# Patient Record
Sex: Female | Born: 1945 | Hispanic: Refuse to answer | Marital: Single | Smoking: Never smoker
Health system: Western US, Academic
[De-identification: ages and names within clinical notes are randomized; demographics above are authoritative.]

## PROBLEM LIST (undated history)

## (undated) MED ORDER — GABAPENTIN 600 MG OR TABS
ORAL_TABLET | ORAL | 0 refills | Status: AC
Start: 2019-01-12 — End: ?

---

## 2017-10-18 ENCOUNTER — Encounter: Payer: Self-pay | Admitting: Hospital

## 2017-10-18 ENCOUNTER — Encounter (INDEPENDENT_AMBULATORY_CARE_PROVIDER_SITE_OTHER): Payer: Self-pay | Admitting: Neurology

## 2017-10-18 ENCOUNTER — Ambulatory Visit (INDEPENDENT_AMBULATORY_CARE_PROVIDER_SITE_OTHER): Admitting: Neurology

## 2017-10-18 VITALS — BP 146/83 | HR 60 | Ht 65.0 in | Wt 177.0 lb

## 2017-10-18 DIAGNOSIS — G2581 Restless legs syndrome: Principal | ICD-10-CM

## 2017-10-18 DIAGNOSIS — G250 Essential tremor: Secondary | ICD-10-CM

## 2017-10-18 DIAGNOSIS — G3184 Mild cognitive impairment, so stated: Secondary | ICD-10-CM

## 2017-10-18 MED ORDER — AZELASTINE HCL 0.05 % OP SOLN: 1.00 [drp] | Freq: Two times a day (BID) | OPHTHALMIC | Status: AC

## 2017-10-18 MED ORDER — TEMAZEPAM 30 MG OR CAPS: 30.00 mg | ORAL_CAPSULE | Freq: Every evening | ORAL | Status: AC | PRN

## 2017-10-18 MED ORDER — ALENDRONATE SODIUM 70 MG OR TABS
70.00 mg | ORAL_TABLET | ORAL | Status: DC
Start: ? — End: 2018-04-20

## 2017-10-18 MED ORDER — GABAPENTIN 600 MG OR TABS
1200.00 mg | ORAL_TABLET | Freq: Three times a day (TID) | ORAL | 3 refills | Status: AC
Start: 2017-10-18 — End: ?

## 2017-10-18 MED ORDER — MONTELUKAST SODIUM 10 MG OR TABS
10.00 mg | ORAL_TABLET | Freq: Every evening | ORAL | Status: DC
Start: ? — End: 2018-10-31

## 2017-10-18 MED ORDER — BUPROPION XL (DAILY) 300 MG OR TB24
300.00 mg | ORAL_TABLET | Freq: Every day | ORAL | Status: DC
Start: ? — End: 2018-04-20

## 2017-10-18 MED ORDER — ESCITALOPRAM OXALATE 20 MG OR TABS
20.00 mg | ORAL_TABLET | Freq: Every day | ORAL | Status: DC
Start: ? — End: 2018-04-20

## 2017-10-18 MED ORDER — GABAPENTIN 600 MG OR TABS
600.00 mg | ORAL_TABLET | Freq: Three times a day (TID) | ORAL | Status: DC
Start: ? — End: 2017-10-18

## 2017-10-18 MED ORDER — TEMAZEPAM 30 MG OR CAPS
ORAL_CAPSULE | ORAL | Status: DC
Start: 2017-10-12 — End: 2018-04-20

## 2017-10-18 MED ORDER — HYDROCODONE-ACETAMINOPHEN 10-325 MG OR TABS
1.00 | ORAL_TABLET | Freq: Four times a day (QID) | ORAL | Status: DC | PRN
Start: ? — End: 2018-01-17

## 2017-10-18 MED ORDER — AMOXICILLIN 500 MG OR CAPS
500.00 mg | ORAL_CAPSULE | Freq: Three times a day (TID) | ORAL | Status: DC
Start: ? — End: 2018-04-20

## 2017-10-18 MED ORDER — PRAMIPEXOLE DIHYDROCHLORIDE 1.5 MG OR TABS
1.50 mg | ORAL_TABLET | Freq: Three times a day (TID) | ORAL | Status: DC
Start: ? — End: 2017-10-18

## 2017-10-18 MED ORDER — PRAMIPEXOLE DIHYDROCHLORIDE 1.5 MG OR TABS
1.5000 mg | ORAL_TABLET | Freq: Three times a day (TID) | ORAL | 3 refills | Status: DC
Start: 2017-10-18 — End: 2018-08-21

## 2017-10-18 MED ORDER — KETOROLAC TROMETHAMINE 0.5 % OP SOLN: 1.00 [drp] | Freq: Four times a day (QID) | OPHTHALMIC | Status: AC

## 2018-01-17 ENCOUNTER — Ambulatory Visit (INDEPENDENT_AMBULATORY_CARE_PROVIDER_SITE_OTHER): Admitting: Neurology

## 2018-01-17 VITALS — BP 126/77 | HR 82

## 2018-01-17 DIAGNOSIS — G2581 Restless legs syndrome: Principal | ICD-10-CM

## 2018-01-17 MED ORDER — HYDROCODONE-ACETAMINOPHEN 10-325 MG OR TABS
1.0000 | ORAL_TABLET | Freq: Every day | ORAL | 0 refills | Status: AC
Start: 2018-01-17 — End: ?

## 2018-01-17 NOTE — Progress Notes (Signed)
Maureen Huang    January 17, 2018      REASON FOR EVALUATION:  RLS, tremor, memory loss     HISTORY:  Maureen Huang is a 72 year old female   R handed.    She is going to have R foot surgery tomorrow for a chronic R foot pain resulted from a previous foot surgery    She still suffers from RLS symptoms when she does not take her medications.  She frequently forgets to take her meds and sometimes she avoids to.   When she takes her medications for RLS, she feels also sedated.    She also rejects of taking pills.    She has used Hydrocodone PRN for pain and only for if the RLS is too disturbing, up to 2 per week.    Currently on Mirapex 1.5mg  TID, Neurontin 600mg , 2 at HS and Restoril 30mg  QHS.   She has failed: Requip, Lyrica,   She has not tried Sinemet, Neupro, Klonopin or Horizant.   She also is not compliant with her antidepressants.      She denies there is decline in her memory.  Again, worse when she is stressed.  Sometimes she does not remember her dog's names.      She is still physically active swimming daily.    She has poor social and family support.    She has has panic attacks.        TESTING:    MMSE from 10/18/17: 27/30, 2/3 delayed recall    REVIEW OF SYSTEMS:  Review of systems are as outlined in the patient questionnaire.      PAST MEDICAL HISTORY:    Depression, RLS, OSA but could not tolerate CPAP, COPD, colon cancer, celiac disease, fibromyalgia, IBS    PAST SURGICAL HISTORY:    Multiple orthopedic surgeries for the R foot, toes, R knee and surgery for colon cancer,      MEDICATIONS:    Current Outpatient Medications   Medication Sig Dispense Refill   . alendronate (FOSAMAX) 70 MG tablet Take 70 mg by mouth every 7 days.     Marland Kitchen amoxicillin (AMOXIL) 500 MG capsule Take 500 mg by mouth 3 times daily.     Marland Kitchen azelastine (OPTIVAR) 0.05 % ophthalmic solution Place 1 drop into both eyes 2 times daily. Use in affected eye(s)     . buPROPion (WELLBUTRIN) 300 MG Extended-Release tablet Take 300 mg by mouth  daily.     Marland Kitchen escitalopram (LEXAPRO) 20 MG tablet Take 20 mg by mouth daily.     Marland Kitchen gabapentin (NEURONTIN) 600 MG tablet Take 2 tablets (1,200 mg) by mouth 3 times daily. 540 tablet 3   . HYDROcodone-acetaminophen (NORCO) 10-325 MG tablet Take 1 tablet by mouth every 6 hours as needed for Moderate Pain (Pain Score 4-6).     Marland Kitchen ketorolac (ACULAR) 0.5 % ophthalmic solution Place 1 drop into both eyes 4 times daily.     . montelukast (SINGULAIR) 10 MG tablet Take 10 mg by mouth every evening.     . pramipexole (MIRAPEX) 1.5 MG tablet Take 1 tablet (1.5 mg) by mouth 3 times daily. 270 tablet 3   . temazepam (RESTORIL) 30 MG capsule      . temazepam (RESTORIL) 30 MG capsule Take 30 mg by mouth nightly as needed for Insomnia.       No current facility-administered medications for this visit.        ALLERGIES:    Allergies  Allergen Reactions   . Morphine Hives       SOCIAL HISTORY:    Lives alone.  No smoking.  1-2 drinks per day     FAMILY HISTORY:    Cancer, CAD, CVA, PD    NEUROLOGIC EXAMINATION:  BP 126/77 (BP Location: Left arm, BP Patient Position: Sitting, BP cuff size: Regular)   Pulse 82     Alert, not oriented to time.            IMPRESSION AND PLAN:   1. RLS (restless legs syndrome)  Same Mirapex 1.5mg  TID, Neurontin 600mg , 2 at HS and Restoril 30mg  QHS.   Consider changing to Neupro later     2. Essential tremor  Continue to monitor for now    3. MCI (mild cognitive impairment)  Consider neuropsych testing if worse.  Will monitor for now.      PLAN:    Follow up 3 months    Will give a Rx of Hydrocodone for #30 for PRN    Continue Mirapex and Neurontin at current dose.    Try Neupro patch next time      I have spent 25 minutes with the patient and family members, of which more than 50% of the time was spent counseling.        Electronically signed by: Bertell Maria, MD   Signature Derived From Controlled Access Password, January 17, 2018, 9:28 AM      Certified, American Board of Neurology and Psychiatry -  Neurology   Movement disorder specialist        CC:   Jerilynn Mages, MD  [address]  [place], [place] [address]

## 2018-01-25 ENCOUNTER — Telehealth (INDEPENDENT_AMBULATORY_CARE_PROVIDER_SITE_OTHER): Payer: Self-pay | Admitting: Neurology

## 2018-01-25 NOTE — Telephone Encounter (Signed)
01/25/18   Pt called to say her pharmacist would not accept the Hydrocodone because the pharmacist felt it is not needed for pt's RLS // Recommended she go to a different pharmacist // Pt said she called her previous neurologist who is sending her medical records to Dr. Regino Schultze // dec

## 2018-04-20 ENCOUNTER — Encounter (INDEPENDENT_AMBULATORY_CARE_PROVIDER_SITE_OTHER): Payer: Self-pay | Admitting: Neurology

## 2018-04-20 ENCOUNTER — Ambulatory Visit (INDEPENDENT_AMBULATORY_CARE_PROVIDER_SITE_OTHER): Admitting: Neurology

## 2018-04-20 VITALS — BP 131/85 | HR 87 | Wt 180.0 lb

## 2018-04-20 DIAGNOSIS — G2581 Restless legs syndrome: Principal | ICD-10-CM

## 2018-04-20 DIAGNOSIS — G250 Essential tremor: Secondary | ICD-10-CM

## 2018-04-20 DIAGNOSIS — G3184 Mild cognitive impairment, so stated: Secondary | ICD-10-CM

## 2018-04-20 MED ORDER — RANITIDINE HCL 150 MG OR TABS
150.00 mg | ORAL_TABLET | Freq: Two times a day (BID) | ORAL | 3 refills | Status: DC
Start: 2018-04-12 — End: 2018-10-31

## 2018-04-20 MED ORDER — OXYBUTYNIN CHLORIDE 5 MG OR TABS
5.00 mg | ORAL_TABLET | Freq: Every day | ORAL | 3 refills | Status: AC
Start: 2018-04-18 — End: ?

## 2018-04-20 MED ORDER — PANTOPRAZOLE SODIUM 40 MG OR TBEC
40.00 mg | DELAYED_RELEASE_TABLET | Freq: Two times a day (BID) | ORAL | 3 refills | Status: AC
Start: 2018-04-12 — End: ?

## 2018-04-20 MED ORDER — CEPHALEXIN 500 MG OR CAPS
ORAL_CAPSULE | ORAL | 0 refills | Status: DC
Start: 2018-02-18 — End: 2018-08-21

## 2018-04-20 NOTE — Interdisciplinary (Signed)
.  I, Hosey Burmester S., have reviewed medications and allergies with this patient.

## 2018-04-20 NOTE — Progress Notes (Signed)
[name]    April 20, 2018      REASON FOR EVALUATION:  RLS, tremor, memory loss     HISTORY:  Maureen Huang is a 72 year old female   R handed.    She reports that her RLE has been better since she recovered from foot surgery when she needed to be at rest.    She is usually active during the day and she does not feel the RLS during the day.    Currently taking Mirapex 1.5mg  2 QHS, Neurontin 600mg , 2 at HS and Restoril 30mg  QHS.   She has not required to take Hydrocodone that was given to her about 4 months ago.      She has failed: Requip, Lyrica,   She has not tried Sinemet, Neupro, Klonopin or Horizant.   She also is not compliant with her antidepressants.  She denies that she is depressed any more      She denies there is decline in her memory.  Again, worse when she is stressed.  Sometimes she does not remember her dog's names.      She reports that there was a mistake in her medical records from Dr. Sherryll Burger indicating that she was opiate dependency and had Hx of ETOH abuse.  She claimed that those statements were not true.  She brought a disclaimer trying to clarify that.        TESTING:    MMSE from 10/18/17: 27/30, 2/3 delayed recall    REVIEW OF SYSTEMS:  Review of systems are as outlined in the patient questionnaire.      PAST MEDICAL HISTORY:    Depression, RLS, OSA but could not tolerate CPAP, COPD, colon cancer, celiac disease, fibromyalgia, IBS    PAST SURGICAL HISTORY:    Multiple orthopedic surgeries for the R foot, toes, R knee and surgery for colon cancer,      MEDICATIONS:    Current Outpatient Medications   Medication Sig Dispense Refill   . azelastine (OPTIVAR) 0.05 % ophthalmic solution Place 1 drop into both eyes 2 times daily. Use in affected eye(s)     . cephALEXin (KEFLEX) 500 MG capsule   0   . gabapentin (NEURONTIN) 600 MG tablet Take 2 tablets (1,200 mg) by mouth 3 times daily. 540 tablet 3   . HYDROcodone-acetaminophen (NORCO) 10-325 MG tablet Take 1 tablet by mouth daily. 30 tablet  0   . ketorolac (ACULAR) 0.5 % ophthalmic solution Place 1 drop into both eyes 4 times daily.     . montelukast (SINGULAIR) 10 MG tablet Take 10 mg by mouth every evening.     Marland Kitchen oxybutynin (DITROPAN) 5 MG tablet Take 5 mg by mouth daily.  3   . pantoprazole (PROTONIX) 40 MG tablet Take 40 mg by mouth 2 times daily.  3   . pramipexole (MIRAPEX) 1.5 MG tablet Take 1 tablet (1.5 mg) by mouth 3 times daily. 270 tablet 3   . ranitidine (ZANTAC) 150 MG tablet Take 150 mg by mouth 2 times daily.  3   . temazepam (RESTORIL) 30 MG capsule Take 30 mg by mouth nightly as needed for Insomnia.       No current facility-administered medications for this visit.        ALLERGIES:    Allergies   Allergen Reactions   . Morphine Hives       SOCIAL HISTORY:    Lives alone.  No smoking.  1-2 drinks per day  FAMILY HISTORY:    Cancer, CAD, CVA, PD    NEUROLOGIC EXAMINATION:  BP 131/85 (BP Location: Left arm, BP Patient Position: Sitting, BP cuff size: Regular)   Pulse 87   Wt 81.6 kg (180 lb)   BMI 29.95 kg/m     Alert and Ox3  Fluent speech   No weakness of the LEs   Normal based and well balanced gait without needing support or assistance.        IMPRESSION AND PLAN:   1. RLS (restless legs syndrome)  Same Mirapex 1.5mg  2 PO QHS, Neurontin 600mg , 2 at HS and Restoril 30mg  QHS.   Consider changing to Neupro later.  Explained why there is no need to change meds for now  Will only give Hydrocodone #30.     2. Essential tremor  Continue to monitor for now    3. MCI (mild cognitive impairment)  Consider neuropsych testing if worse.  Will monitor for now.      PLAN:    Follow up 4 months    She still has Hydrocodone for #30 yearly for PRN leg pain    Same Mirapex and Neurontin at current dose.    Consider Neupro patch later if needed      I have spent 25 minutes with the patient and family members, of which more than 50% of the time was spent counseling.        Electronically signed by: Bertell Maria, MD   Signature Derived From  Controlled Access Password, April 20, 2018, 9:28 AM      Certified, American Board of Neurology and Psychiatry - Neurology   Movement disorder specialist        CC:   Jerilynn Mages, MD  [address]  [place], [place] [address]

## 2018-08-21 ENCOUNTER — Encounter (INDEPENDENT_AMBULATORY_CARE_PROVIDER_SITE_OTHER): Payer: Self-pay

## 2018-08-21 ENCOUNTER — Ambulatory Visit (INDEPENDENT_AMBULATORY_CARE_PROVIDER_SITE_OTHER)

## 2018-08-21 ENCOUNTER — Telehealth (INDEPENDENT_AMBULATORY_CARE_PROVIDER_SITE_OTHER): Payer: Self-pay

## 2018-08-21 VITALS — BP 131/85 | HR 77 | Ht 65.0 in | Wt 172.6 lb

## 2018-08-21 DIAGNOSIS — G2581 Restless legs syndrome: Principal | ICD-10-CM

## 2018-08-21 DIAGNOSIS — G3184 Mild cognitive impairment, so stated: Secondary | ICD-10-CM

## 2018-08-21 MED ORDER — ROTIGOTINE 4 MG/24HR TD PT24
1.0000 | MEDICATED_PATCH | Freq: Every day | TRANSDERMAL | 3 refills | Status: DC
Start: 2018-08-21 — End: 2018-08-24

## 2018-08-24 MED ORDER — ROTIGOTINE 4 MG/24HR TD PT24
1.0000 | MEDICATED_PATCH | Freq: Every day | TRANSDERMAL | 3 refills | Status: DC
Start: 2018-08-24 — End: 2018-10-31

## 2018-08-25 ENCOUNTER — Telehealth (INDEPENDENT_AMBULATORY_CARE_PROVIDER_SITE_OTHER): Payer: Self-pay

## 2018-08-25 NOTE — Telephone Encounter (Signed)
Noted//ps

## 2018-08-31 NOTE — Telephone Encounter (Signed)
Noted//ps

## 2018-09-12 ENCOUNTER — Telehealth (INDEPENDENT_AMBULATORY_CARE_PROVIDER_SITE_OTHER): Payer: Self-pay | Admitting: Neurology

## 2018-10-31 ENCOUNTER — Ambulatory Visit (INDEPENDENT_AMBULATORY_CARE_PROVIDER_SITE_OTHER): Admitting: Neurology

## 2018-10-31 ENCOUNTER — Telehealth (INDEPENDENT_AMBULATORY_CARE_PROVIDER_SITE_OTHER): Payer: Self-pay | Admitting: Neurology

## 2018-10-31 VITALS — BP 127/86 | HR 97 | Ht 65.0 in | Wt 174.4 lb

## 2018-10-31 DIAGNOSIS — G2581 Restless legs syndrome: Principal | ICD-10-CM

## 2018-10-31 DIAGNOSIS — G3184 Mild cognitive impairment, so stated: Secondary | ICD-10-CM

## 2018-10-31 DIAGNOSIS — G250 Essential tremor: Secondary | ICD-10-CM

## 2019-01-12 ENCOUNTER — Other Ambulatory Visit (INDEPENDENT_AMBULATORY_CARE_PROVIDER_SITE_OTHER): Payer: Self-pay | Admitting: Neurology

## 2019-02-01 ENCOUNTER — Encounter (INDEPENDENT_AMBULATORY_CARE_PROVIDER_SITE_OTHER)

## 2019-05-02 ENCOUNTER — Telehealth: Payer: Self-pay | Admitting: Hospital

## 2019-05-02 NOTE — Telephone Encounter (Signed)
Verified: Mobile Phone   MyChart Activation code sent to patient's: Mobile Phone

## 2019-05-17 ENCOUNTER — Ambulatory Visit (INDEPENDENT_AMBULATORY_CARE_PROVIDER_SITE_OTHER)

## 2020-11-26 IMAGING — CT CTA CHEST
2 of 3 series · 16 of 46 positions shown, 18 images · IV contrast (APPLIED)
Comparison: None

CTA CHEST, 11/26/2020 [DATE]: 
CLINICAL INDICATION: Thoracic aortic aneurysm. 
A search for DICOM formatted images was conducted for prior CT imaging studies 
completed at a non-affiliated media free facility.
TECHNIQUE: The chest was scanned from base of neck through the lung bases with 
100 mL of Isovue 370 injected intravenously on a high resolution low dose CT 
scanner using dose reduction techniques.  Routine MPR and MIP 3D renderings were 
reconstructed on an independent workstation with concurrent physician 
supervision. I stat GFR prior to imaging 61.5.

[Series 4: cta chest 2.0 i31s 3 · axial · 0.71mm/px · z∈[-255,+9]mm · 13 of 152 slices shown, 15 images]
[im 10/152  soft-tissue]
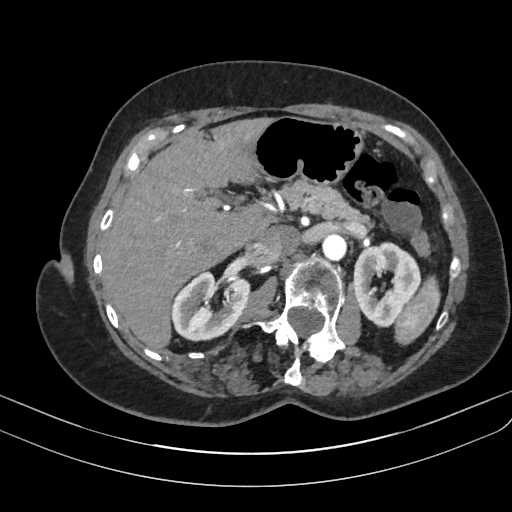
[im 10/152  bone]
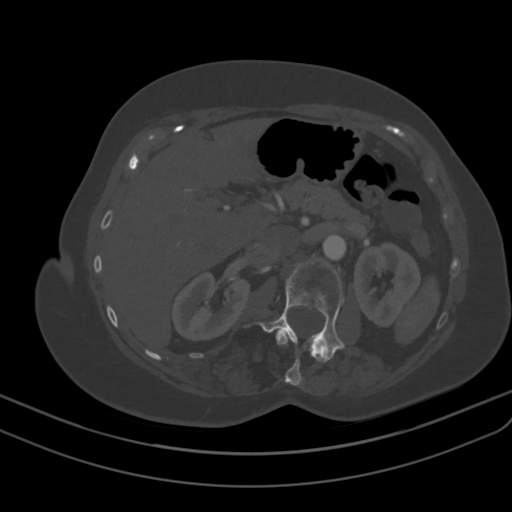
[im 20/152  soft-tissue]
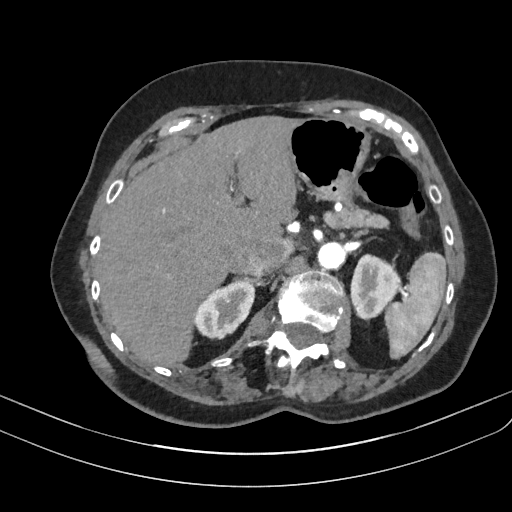
[im 30/152  soft-tissue]
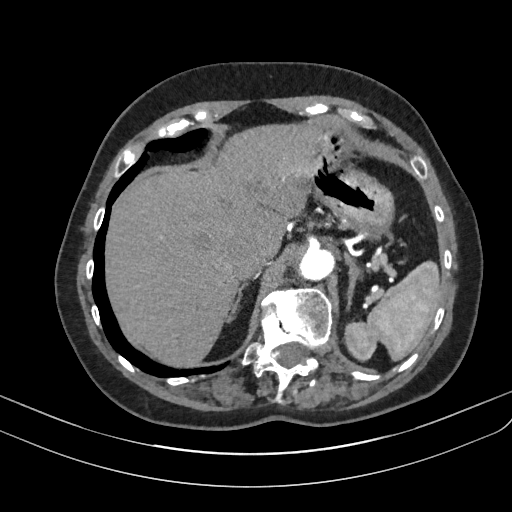
[im 44/152  soft-tissue]
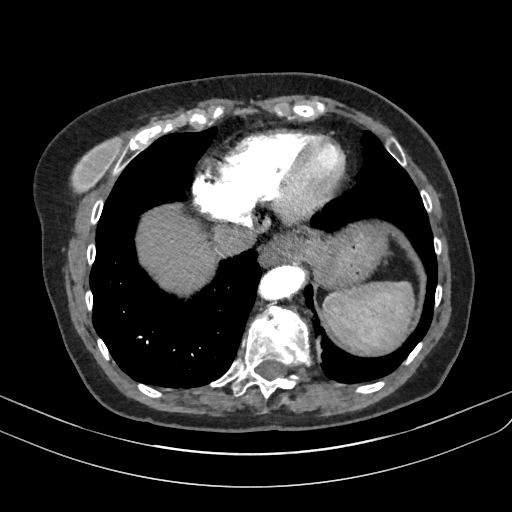
[im 54/152  soft-tissue]
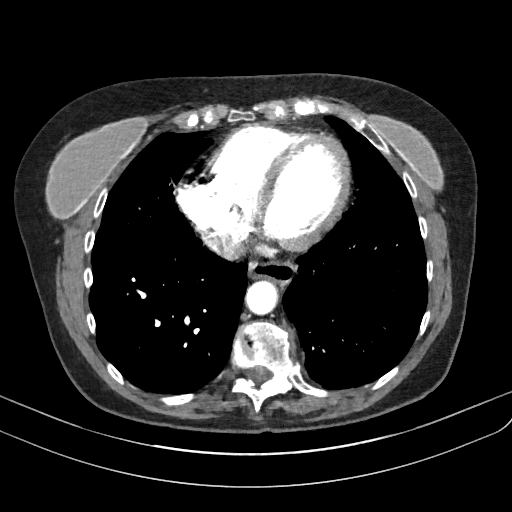
[im 64/152  soft-tissue]
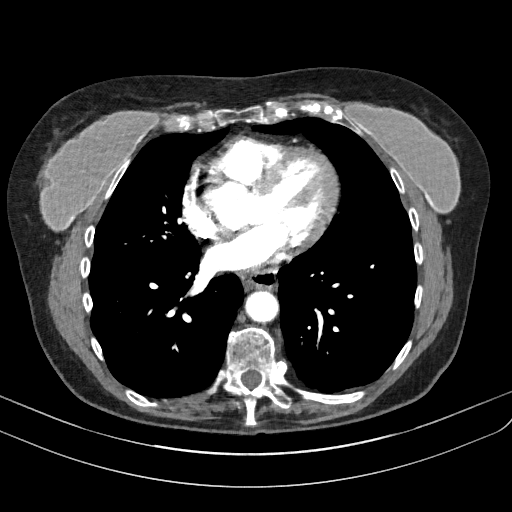
[im 78/152  soft-tissue]
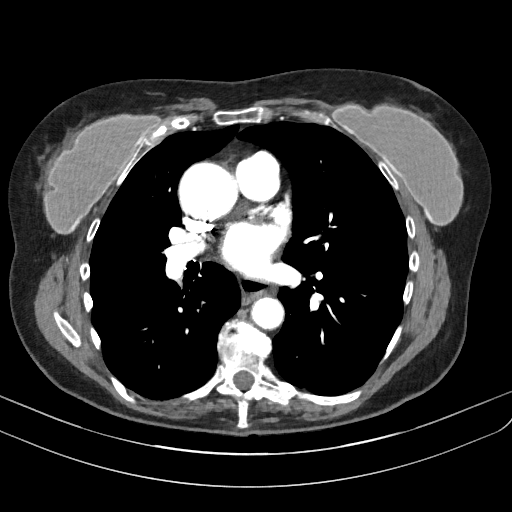
[im 88/152  soft-tissue]
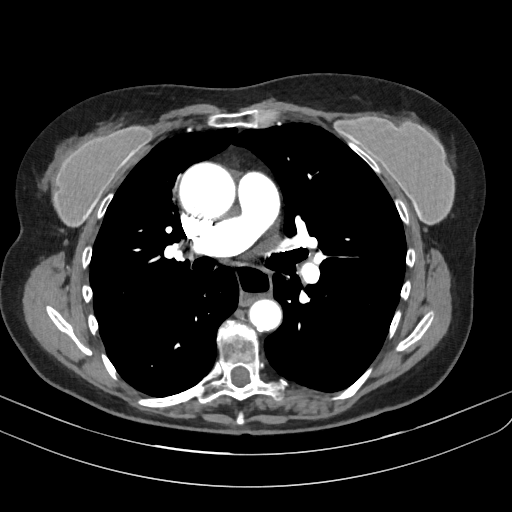
[im 98/152  soft-tissue]
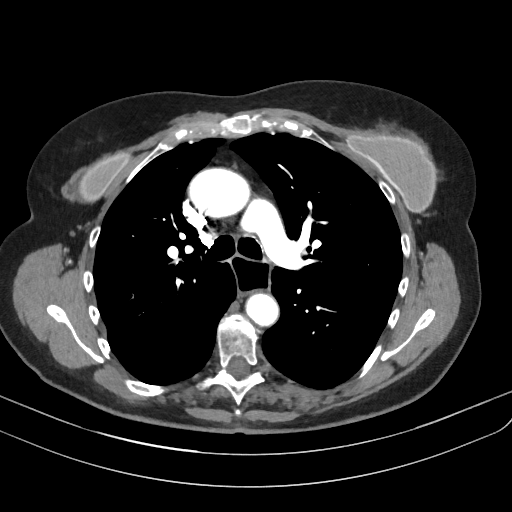
[im 98/152  bone]
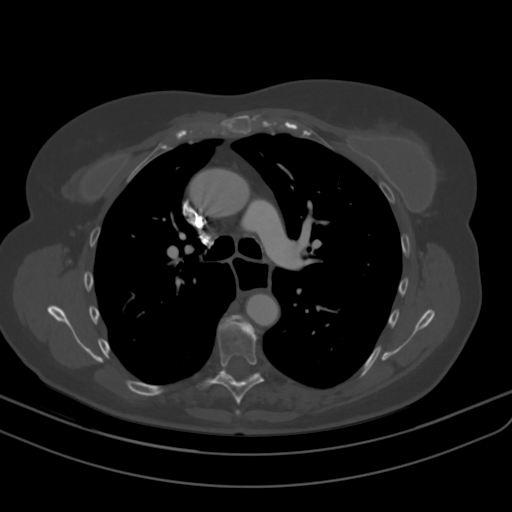
[im 108/152  soft-tissue]
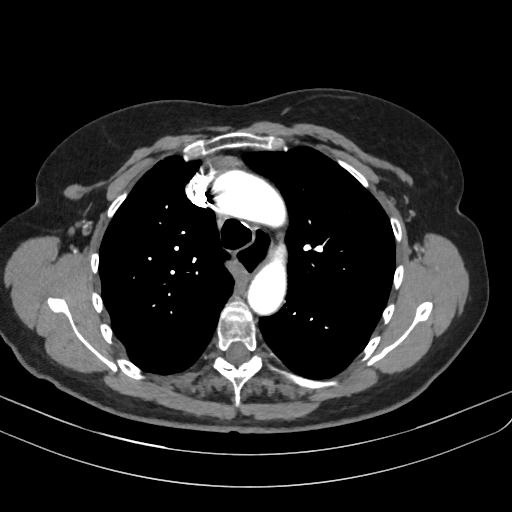
[im 122/152  soft-tissue]
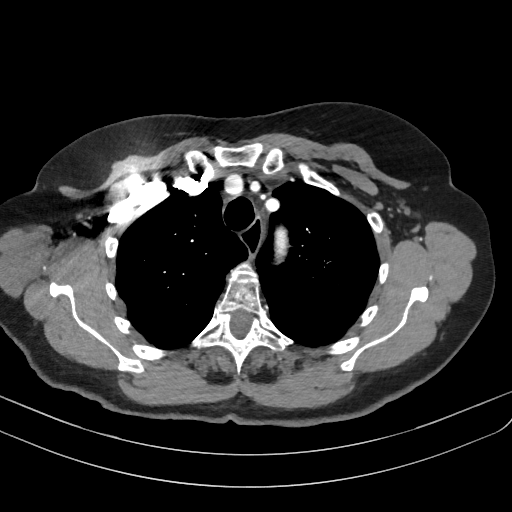
[im 132/152  soft-tissue]
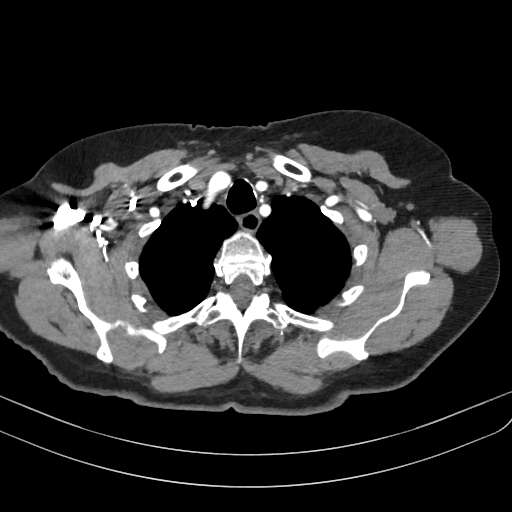
[im 142/152  soft-tissue]
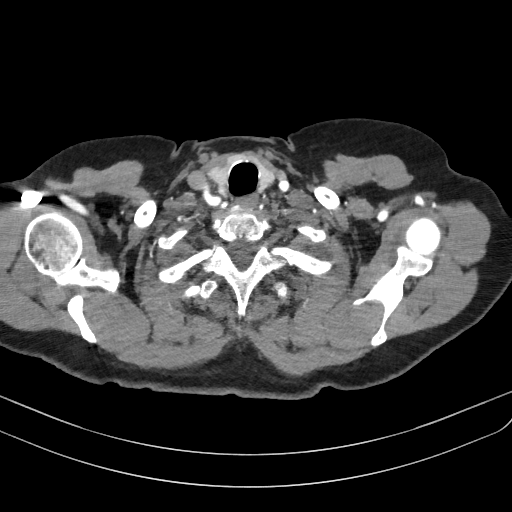

[Series 6: coronal · coronal · 0.59mm/px · 3 of 144 slices shown]
[im 48/144  soft-tissue]
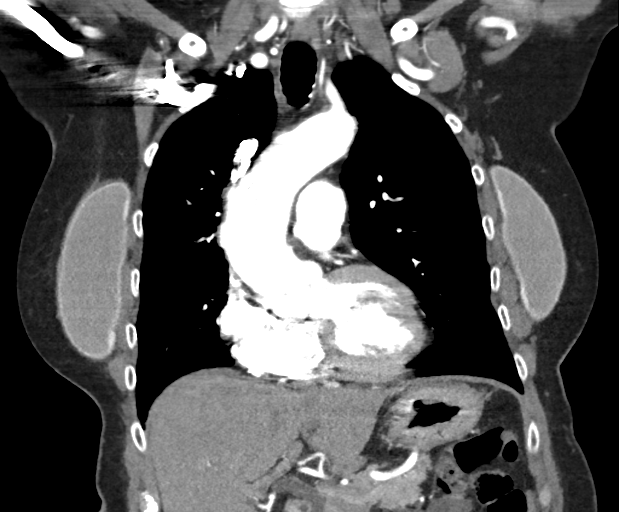
[im 64/144  soft-tissue]
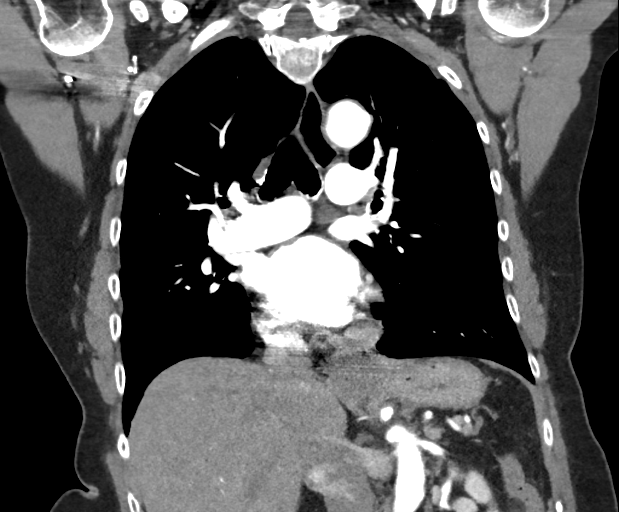
[im 80/144  soft-tissue]
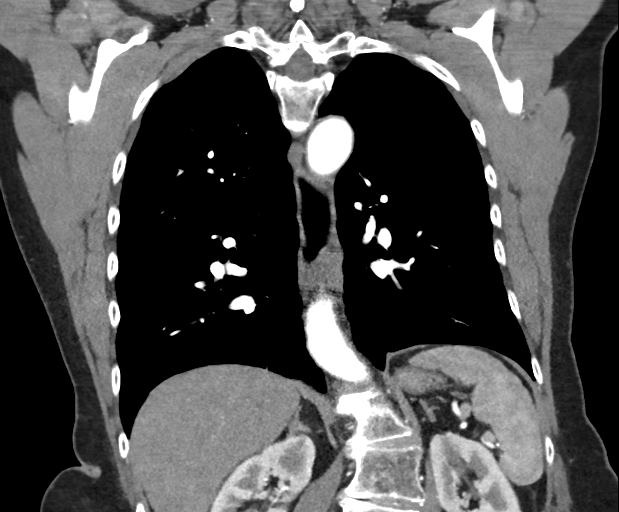

[16 of 46 positions shown; findings below may reference images not displayed]

FINDINGS: PULMONARY ARTERIES: No pulmonary embolus. 
LUNGS AND PLEURA: Lungs clear.   No effusions. 
MEDIASTINUM: No masses. Moderate distention esophagus with small hiatal hernia 
mild wall thickening of the gastroesophageal junction. Bilateral breast 
implants. 
LYMPH NODES: No adenopathy. 
HEART: Normal in size.  No pericardial effusion. Left ventricular outflow 
calcification. 
AORTA AND GREAT VESSELS: Mild fusiform dilatation ascending aorta measuring
cm.. 
OSSEOUS STRUCTURES: Degenerative change and levoscoliosis. No acute fracture or 
destructive lesion. 
UPPER ABDOMEN: Fatty liver.
IMPRESSION: 4.1 cm fusiform dilatation ascending aorta. 
Distended esophagus with small hiatal hernia mild wall thickening of the 
gastroesophageal junction. 
No mediastinal or hilar adenopathy. 
RADIATION DOSE REDUCTION: All CT scans are performed using radiation dose 
reduction techniques, when applicable.  Technical factors are evaluated and 
adjusted to ensure appropriate moderation of exposure.  Automated dose 
management technology is applied to adjust the radiation doses to minimize 
exposure while achieving diagnostic quality images.

## 2020-12-18 IMAGING — DX LUMBAR SPINE COMPLETE 4 VIEWS
1 series · 4 of 4 positions shown · non-contrast
Comparison: None.

THORACIC SPINE 3 VIEWS, LUMBAR SPINE COMPLETE 4 VIEWS, 12/18/2020 [DATE]: 
CLINICAL INDICATION:  Chronic back pain

[Series 1: lateral · 0.14mm/px · 4 of 4 slices shown]
[im 1/4]
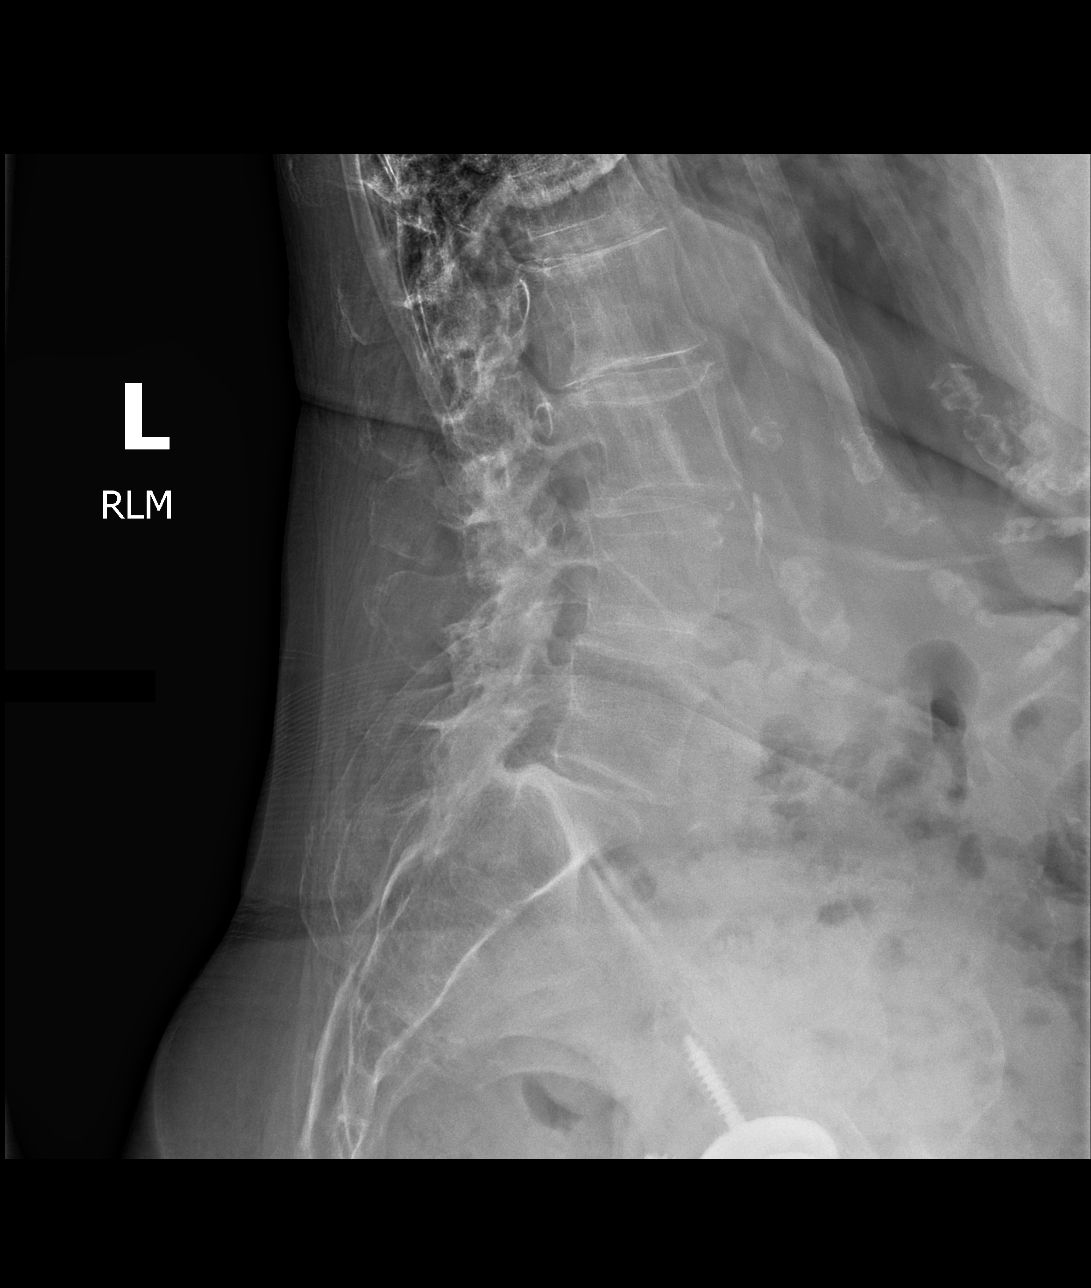
[im 2/4]
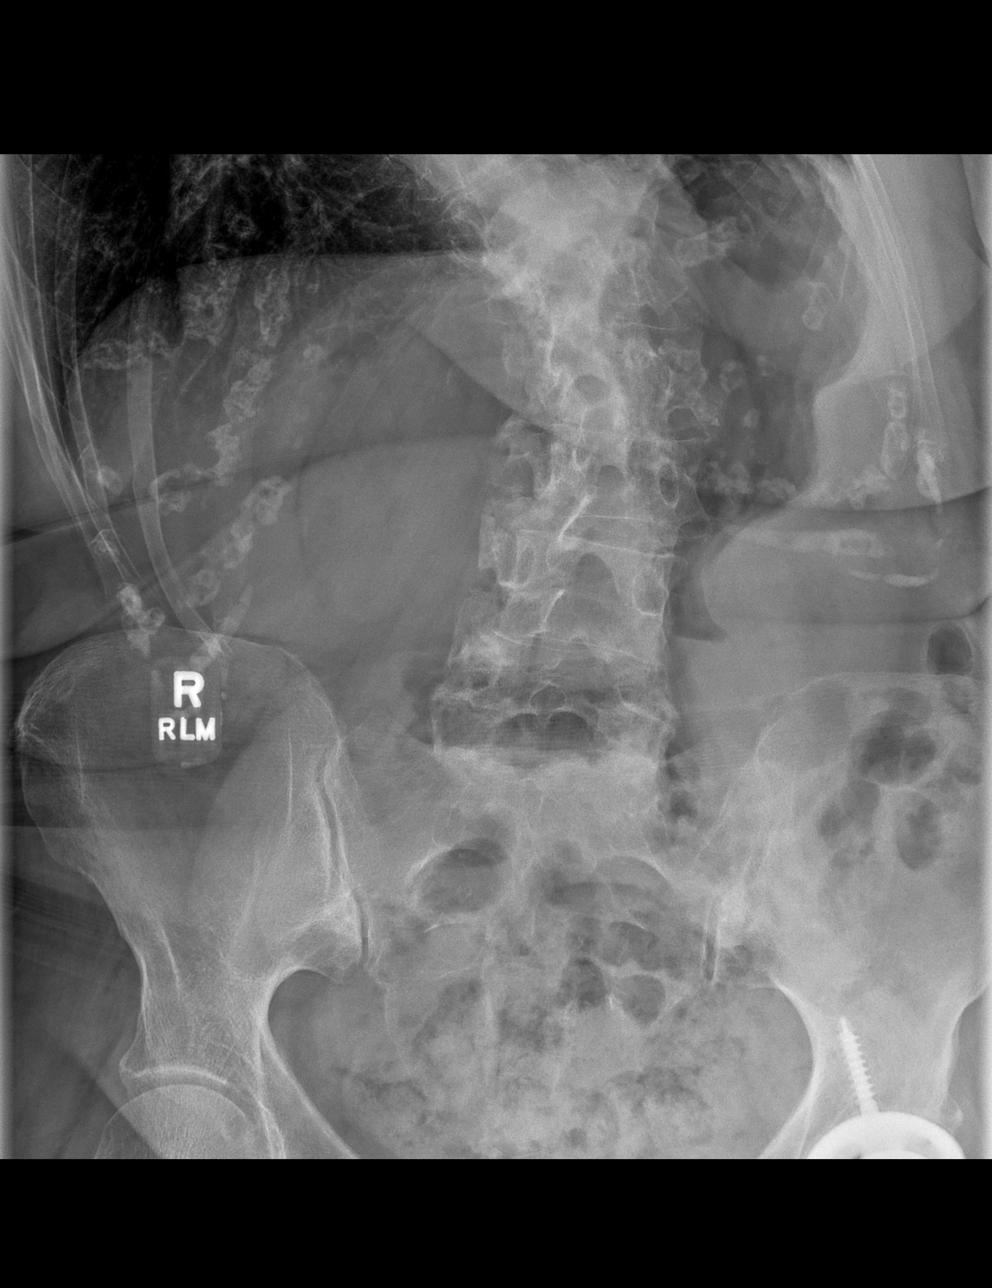
[im 3/4]
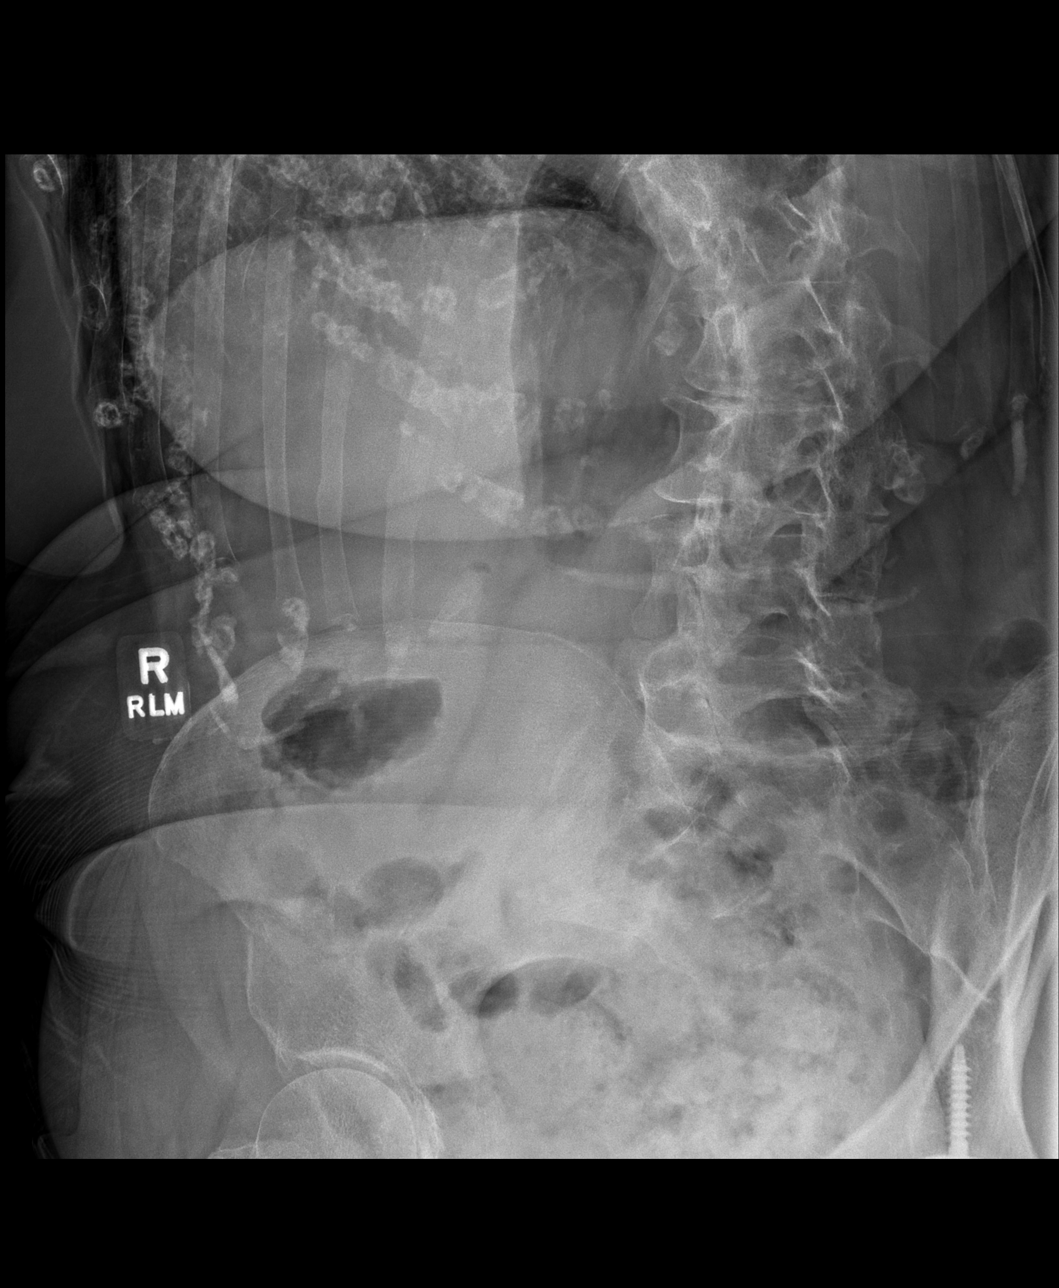
[im 4/4]
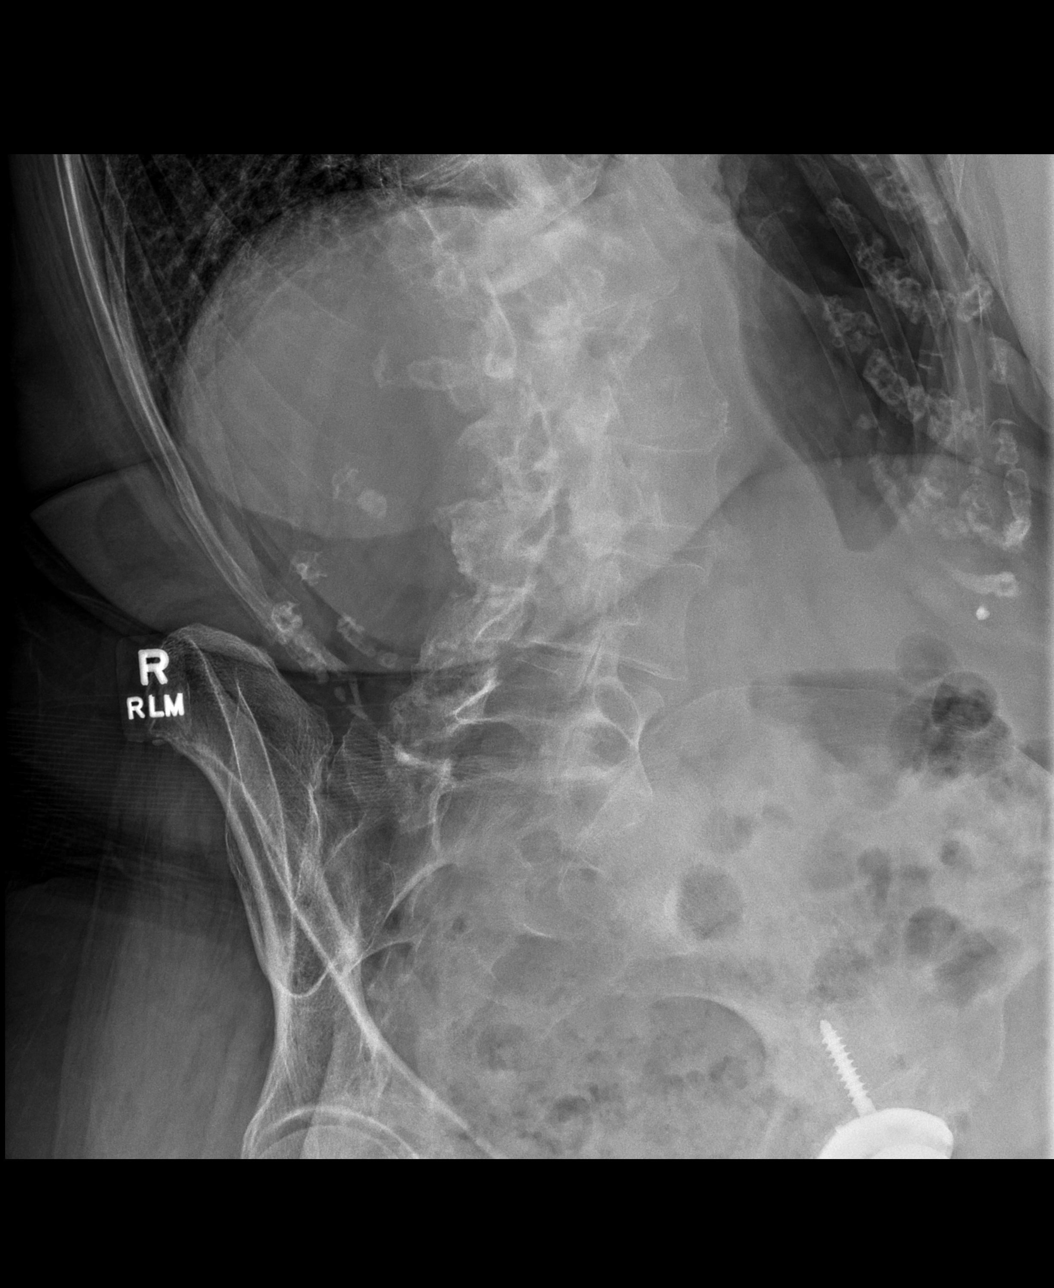

[4 of 4 positions shown; findings below may reference images not displayed]

FINDINGS: There is thoracolumbar levoscoliosis. Curve angle is 126 degrees. 
There is mild broad-based thoracic dextrocurvature, with curve angle 145 
degrees. Angles are provided corresponding to the comparison radiograph the 
patient brought in. The patient provides a comparison radiograph showing 
respective curve angles 135 and 152 degrees. Allowing for measurement 
variability, the appearance is not significantly different from the prior study. 
Prior outside radiograph from April 27, 2019 is available, also not showing 
significant interval change. 
There are advanced degenerative changes at the thoracolumbar junction. There 
appears to be loss of height along the upper L1 and inferior T12 endplates, most 
likely chronic. There is less than grade 1 anterolisthesis at L5-S1, and slight 
rotatory anterolisthesis at L3-4 and L4-5. 
No paraspinal mass evident. Lungs appear well expanded.
IMPRESSION: Broad-based thoracic dextrocurvature and moderately severe thoracolumbar 
levocurvature as described, not significantly different from the prior 
radiograph, April 27, 2019. MRI of the lumbar spine to include the thoracolumbar 
junction would be useful for further evaluation. 
Left hip prosthesis.

## 2020-12-18 IMAGING — DX THORACIC SPINE 3 VIEWS
1 series · 3 of 3 positions shown · non-contrast
Comparison: None.

THORACIC SPINE 3 VIEWS, LUMBAR SPINE COMPLETE 4 VIEWS, 12/18/2020 [DATE]: 
CLINICAL INDICATION:  Chronic back pain

[Series 1: AP · 0.14mm/px · 3 of 3 slices shown]
[im 1/3]
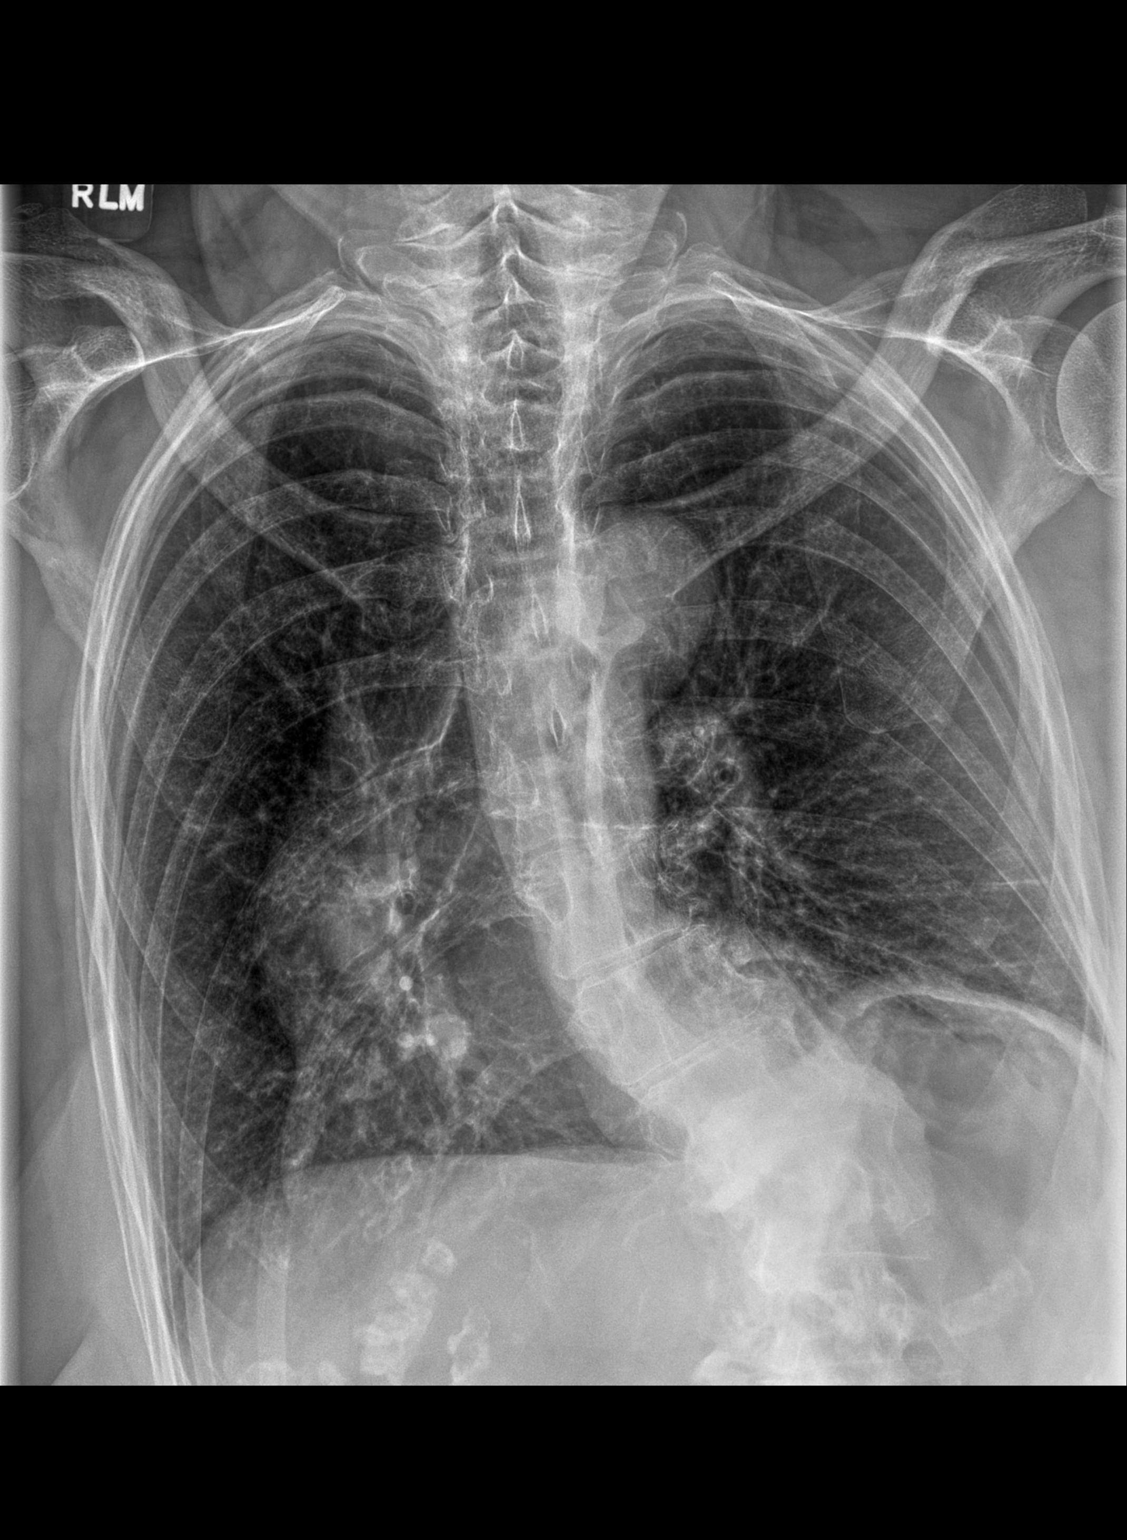
[im 2/3]
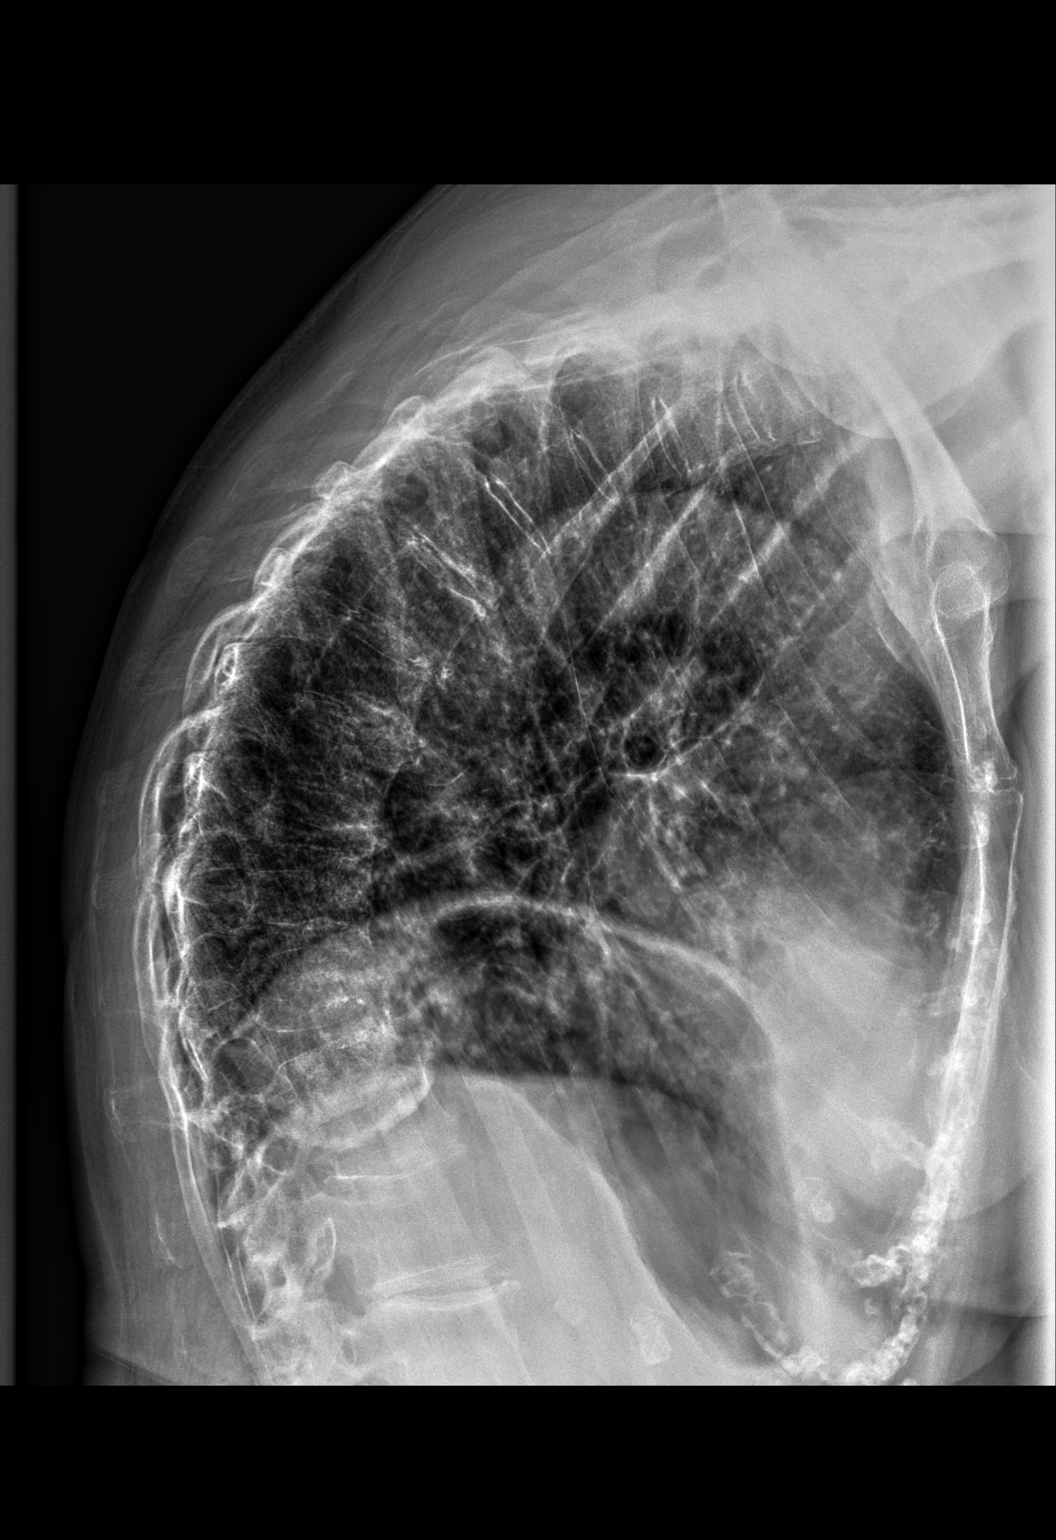
[im 3/3]
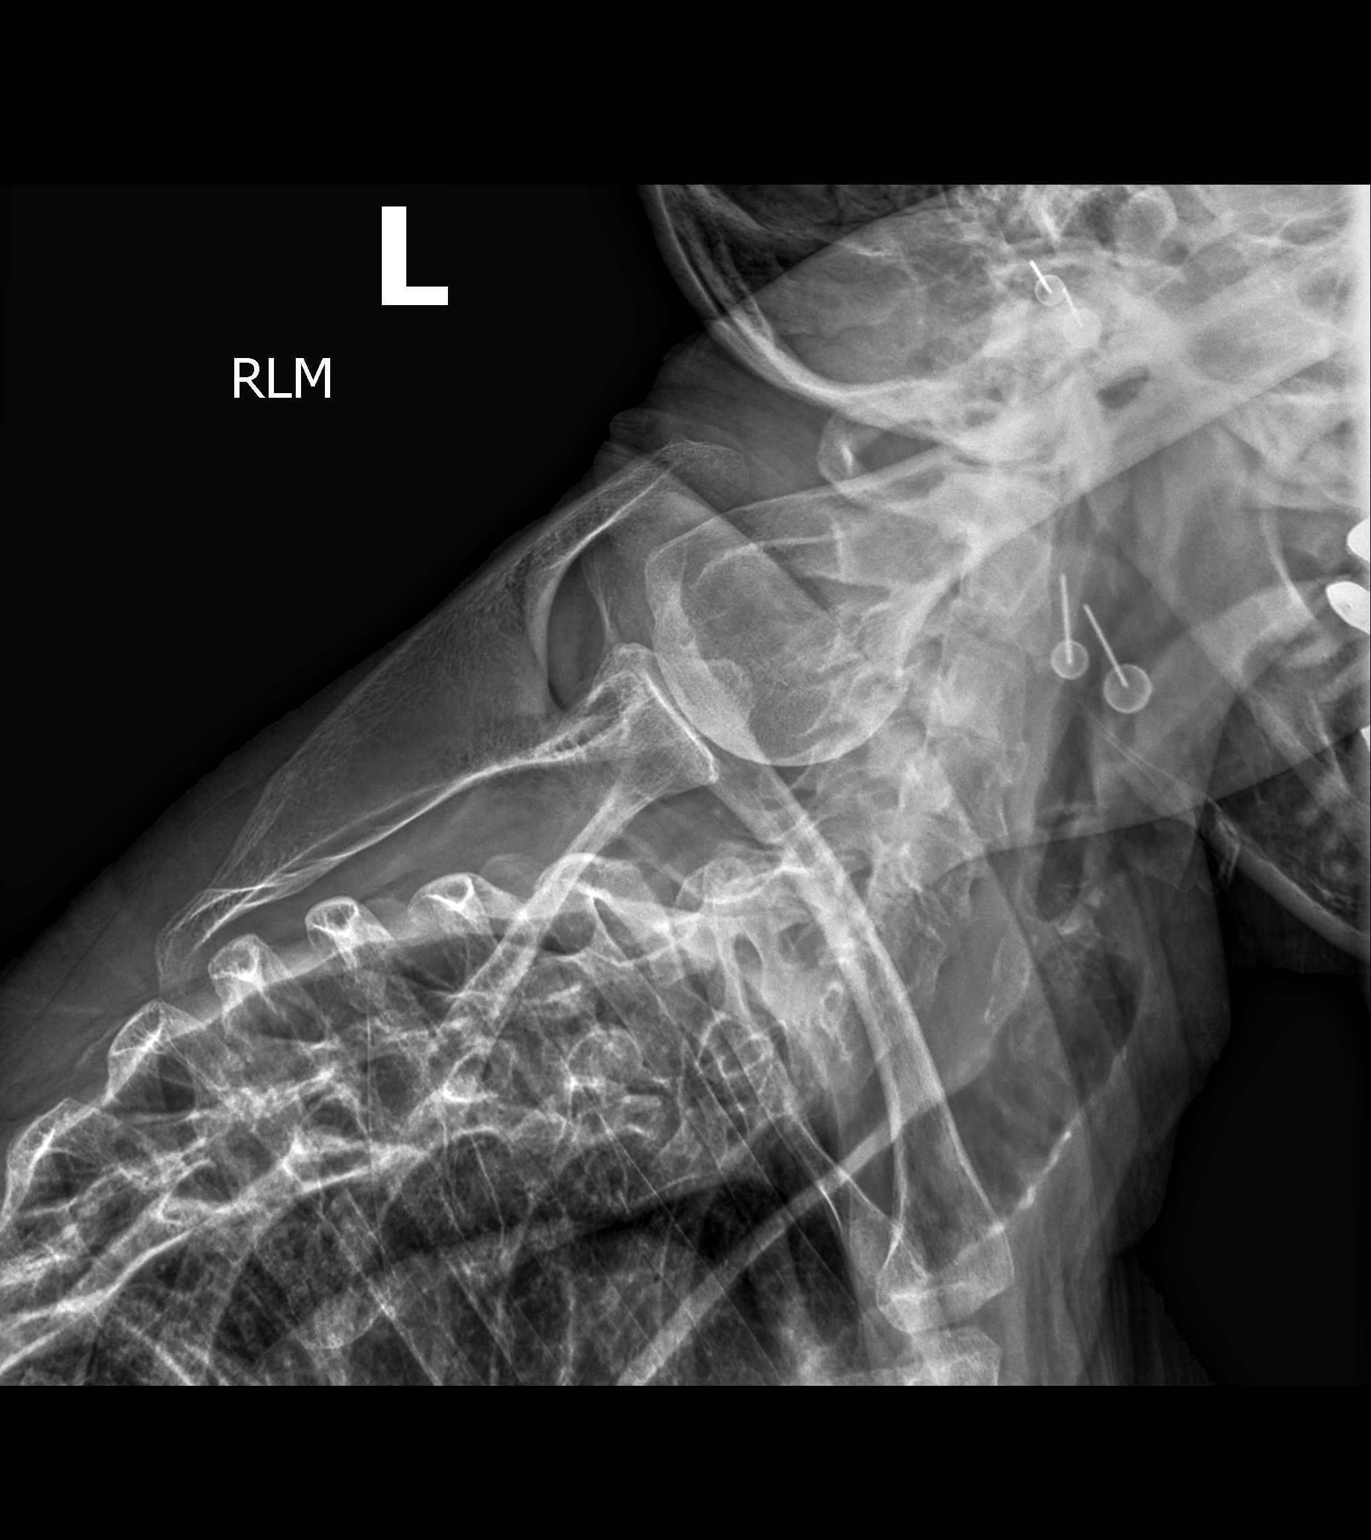

[3 of 3 positions shown; findings below may reference images not displayed]

FINDINGS: There is thoracolumbar levoscoliosis. Curve angle is 126 degrees. 
There is mild broad-based thoracic dextrocurvature, with curve angle 145 
degrees. Angles are provided corresponding to the comparison radiograph the 
patient brought in. The patient provides a comparison radiograph showing 
respective curve angles 135 and 152 degrees. Allowing for measurement 
variability, the appearance is not significantly different from the prior study. 
Prior outside radiograph from April 27, 2019 is available, also not showing 
significant interval change. 
There are advanced degenerative changes at the thoracolumbar junction. There 
appears to be loss of height along the upper L1 and inferior T12 endplates, most 
likely chronic. There is less than grade 1 anterolisthesis at L5-S1, and slight 
rotatory anterolisthesis at L3-4 and L4-5. 
No paraspinal mass evident. Lungs appear well expanded.
IMPRESSION: Broad-based thoracic dextrocurvature and moderately severe thoracolumbar 
levocurvature as described, not significantly different from the prior 
radiograph, April 27, 2019. MRI of the lumbar spine to include the thoracolumbar 
junction would be useful for further evaluation. 
Left hip prosthesis.

## 2021-03-31 IMAGING — DX CERVICAL SPINE 4 OR 5 VIEWS
1 series · 5 of 5 positions shown · non-contrast
Comparison: Radiographs of 12/18/2020. CT exam of 11/26/2020.

CERVICAL SPINE 4 OR 5 VIEWS, 03/31/2021 [DATE]: 
CLINICAL INDICATION:  Fall occurred Thursday on 03/26/2021 onto back with 
increased low back pain and mid back pain. History of osteopenia. History of 
left hip replacement. History of colon cancer with colon resection 7925.

[Series 1: AP · 0.14mm/px · 5 of 5 slices shown]
[im 1/5]
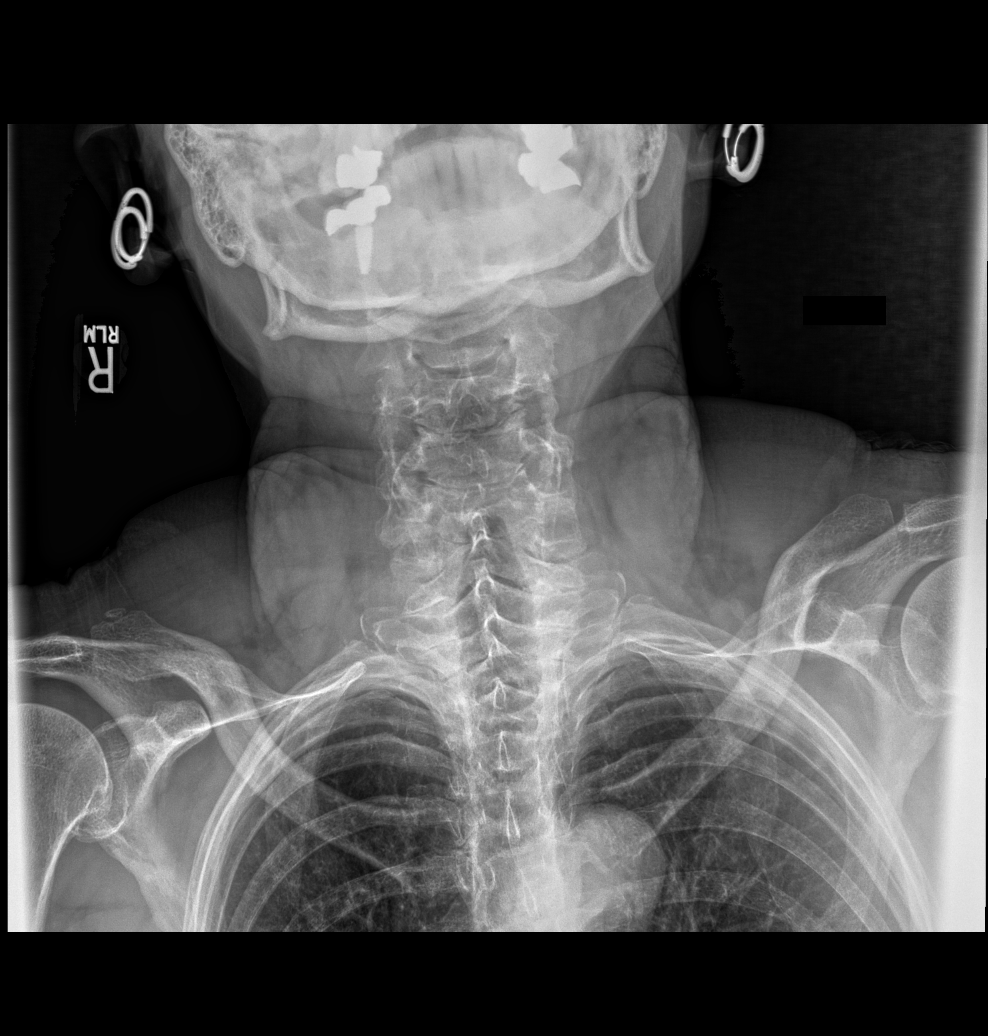
[im 2/5]
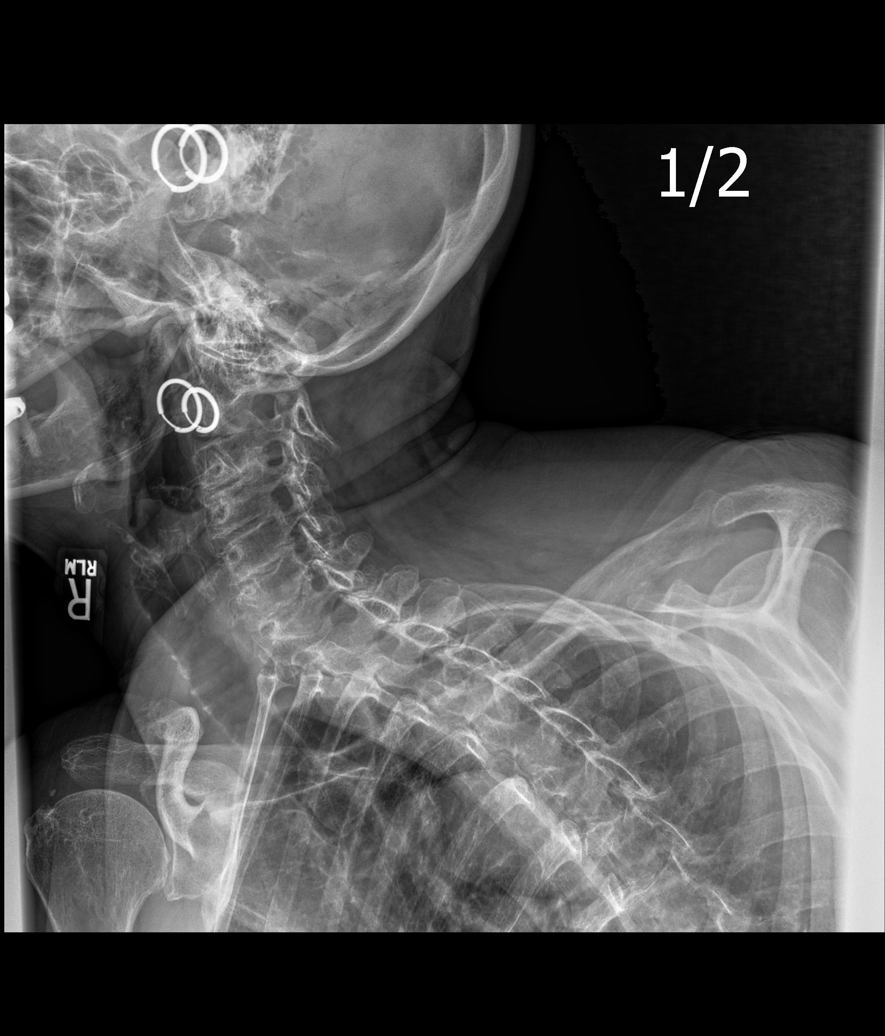
[im 3/5]
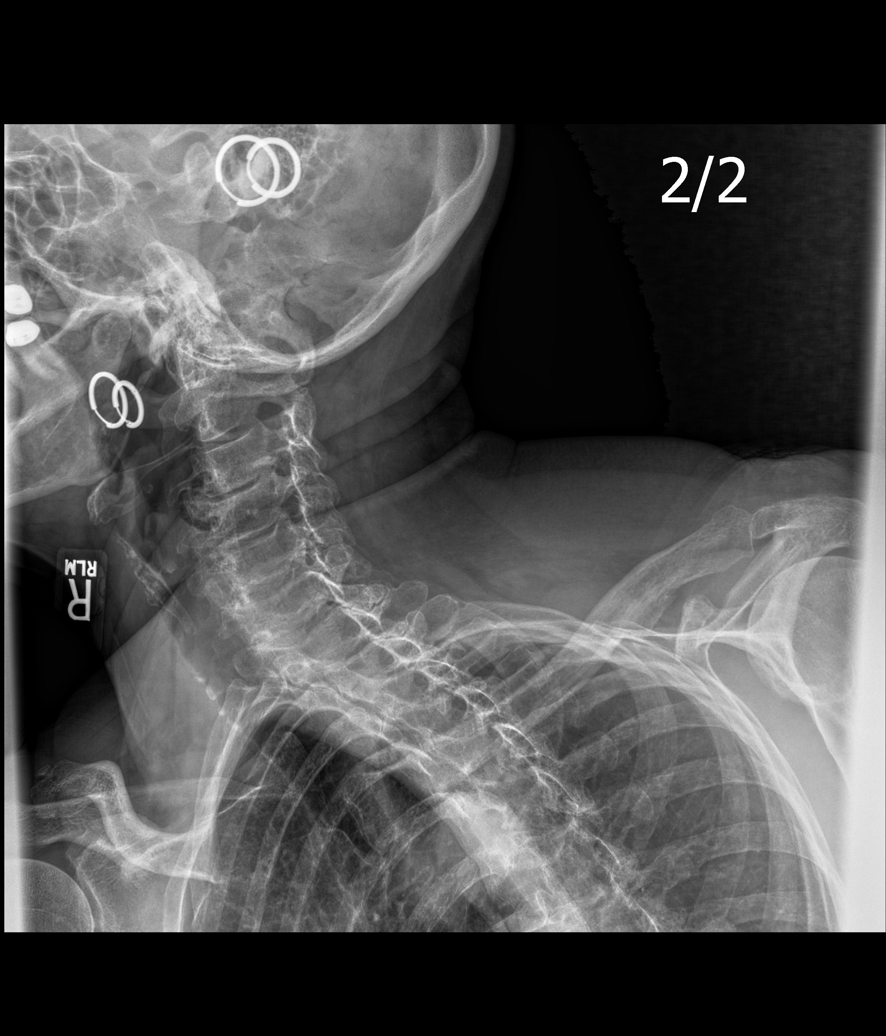
[im 4/5]
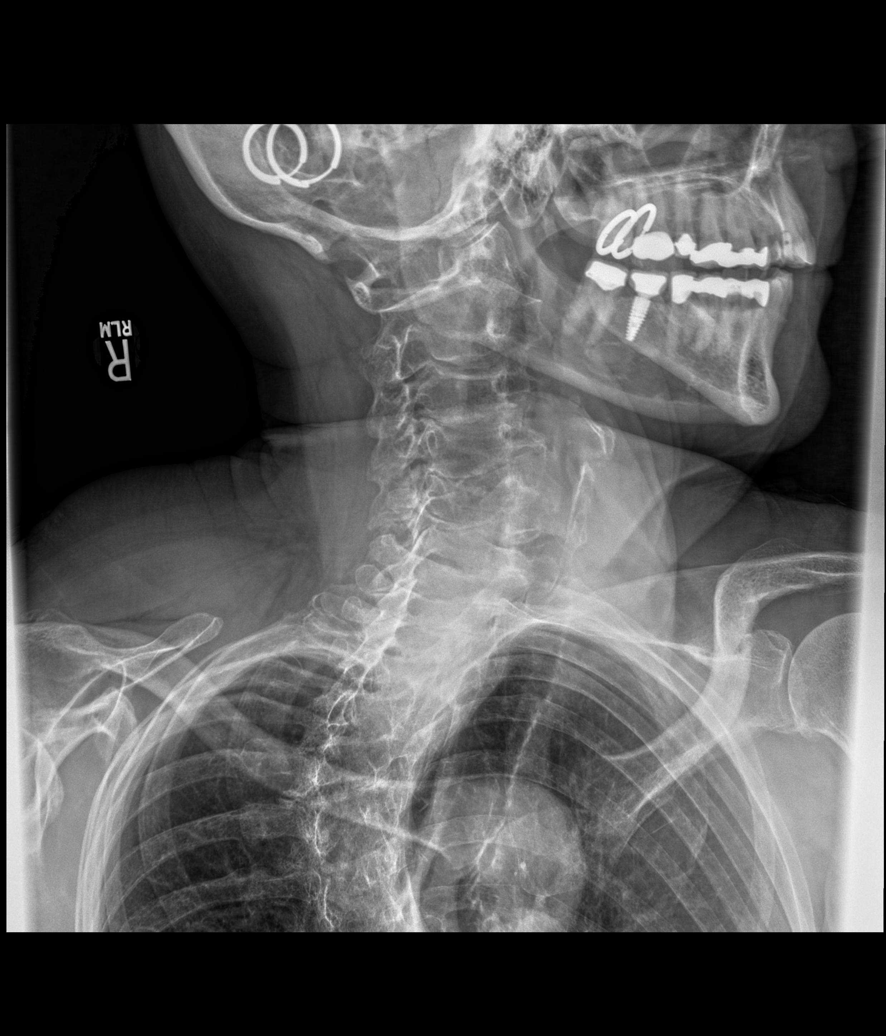
[im 5/5]
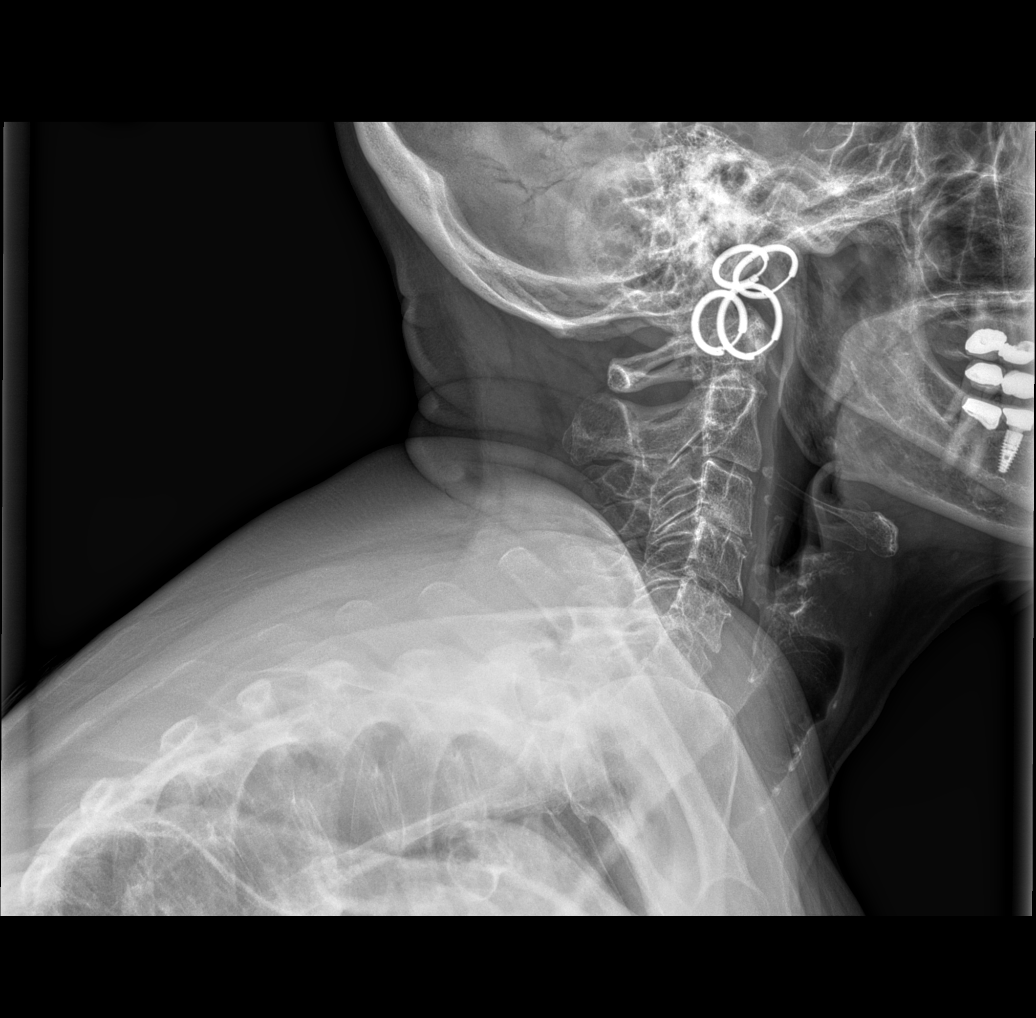

[5 of 5 positions shown; findings below may reference images not displayed]

FINDINGS: Kyphosis and thoracolumbar curvature. Osteopenia. Diffuse disc space 
narrowing and scattered osteophytic spurring of the cervical, thoracic and 
lumbar spine. Scattered facet hypertrophy with greatest involvement of the 
cervical and lower lumbar spine. Trace retrolisthesis C3 on C4 No discrete acute 
vertebral body fracture however this could be easily obscured. 
Left THA is partially included. Right hip joint space appears preserved. Mild 
degenerative changes of the SI joints with mild osteophytic spurring. Sacral 
alar are intact. Normal sacrococcygeal alignment. No discrete sacral fracture. 
Mild bibasilar atelectasis. Included portions of the lungs are otherwise clear. 
Normal bowel gas pattern.
IMPRESSION: 1.  Thoracolumbar curvature with spondylotic changes. 
2.  No discrete acute osseous abnormality. However, this could be easily 
obscured in this osteopenic patient. If symptoms persist, recommend MR exam.

## 2021-03-31 IMAGING — DX THORACIC SPINE 3 VIEWS
1 series · 3 of 3 positions shown · non-contrast
Comparison: Radiographs of 12/18/2020. CT exam of 11/26/2020.

CERVICAL SPINE 4 OR 5 VIEWS, 03/31/2021 [DATE]: 
CLINICAL INDICATION:  Fall occurred Thursday on 03/26/2021 onto back with 
increased low back pain and mid back pain. History of osteopenia. History of 
left hip replacement. History of colon cancer with colon resection 7925.

[Series 1: AP · 0.14mm/px · 3 of 3 slices shown]
[im 1/3]
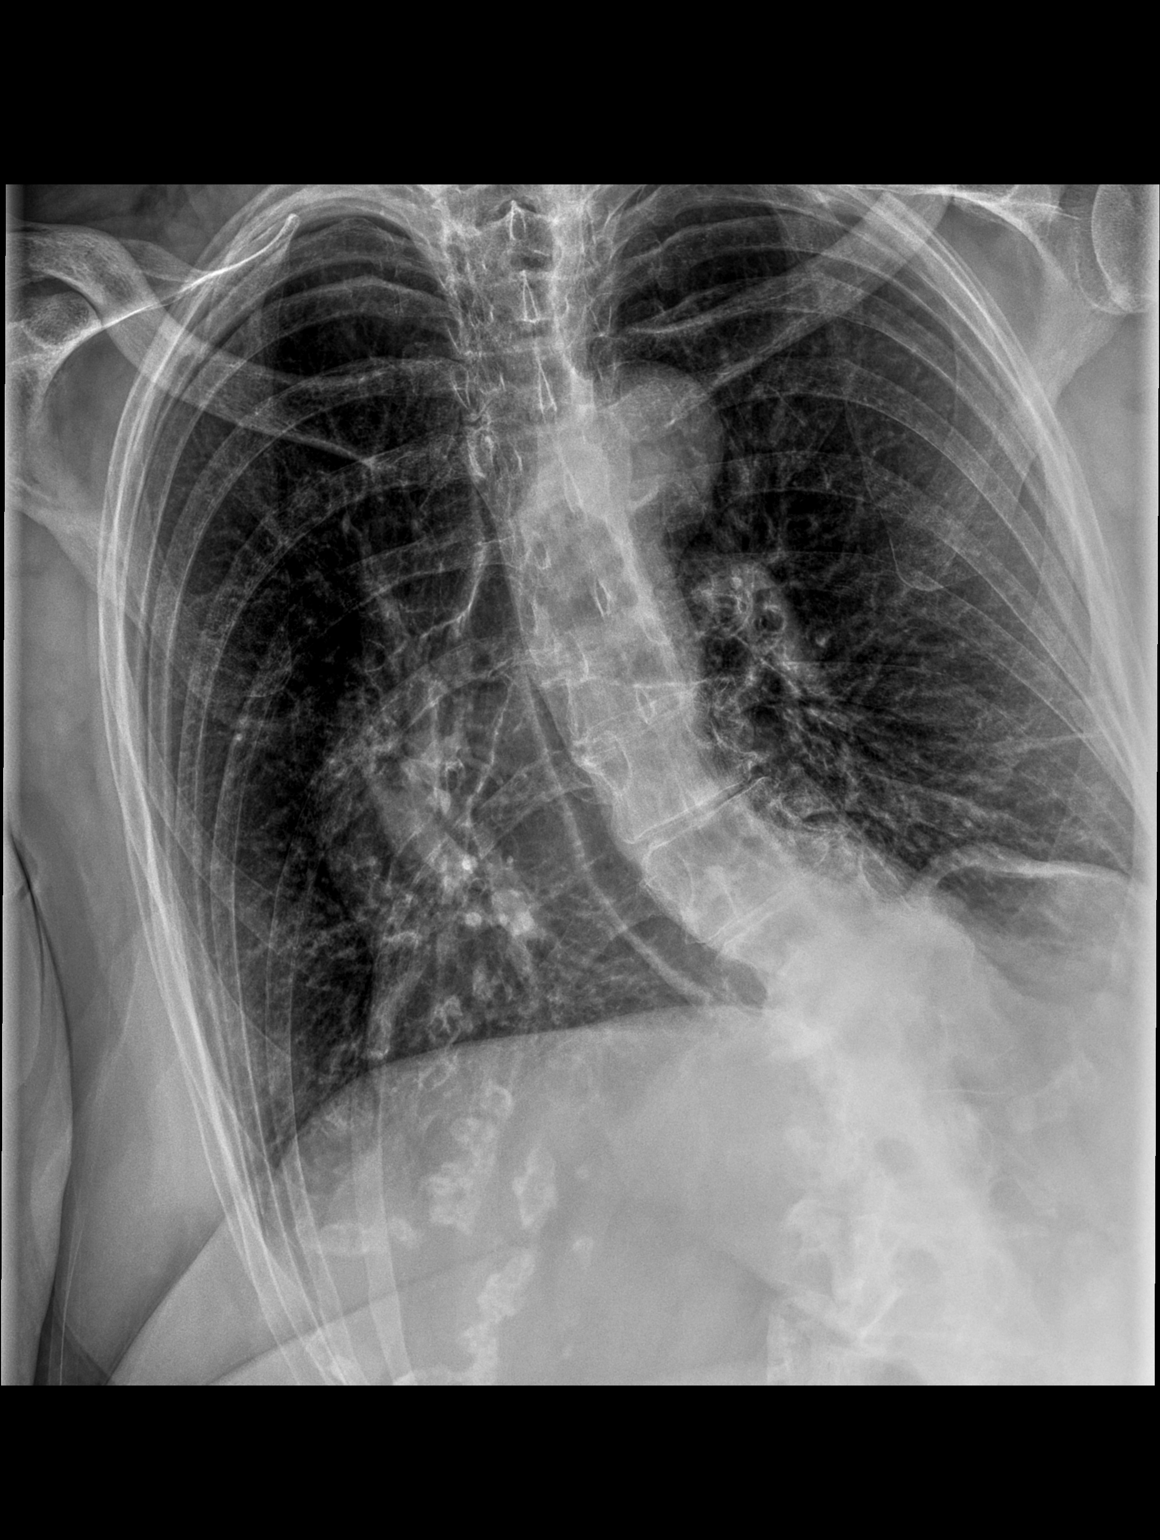
[im 2/3]
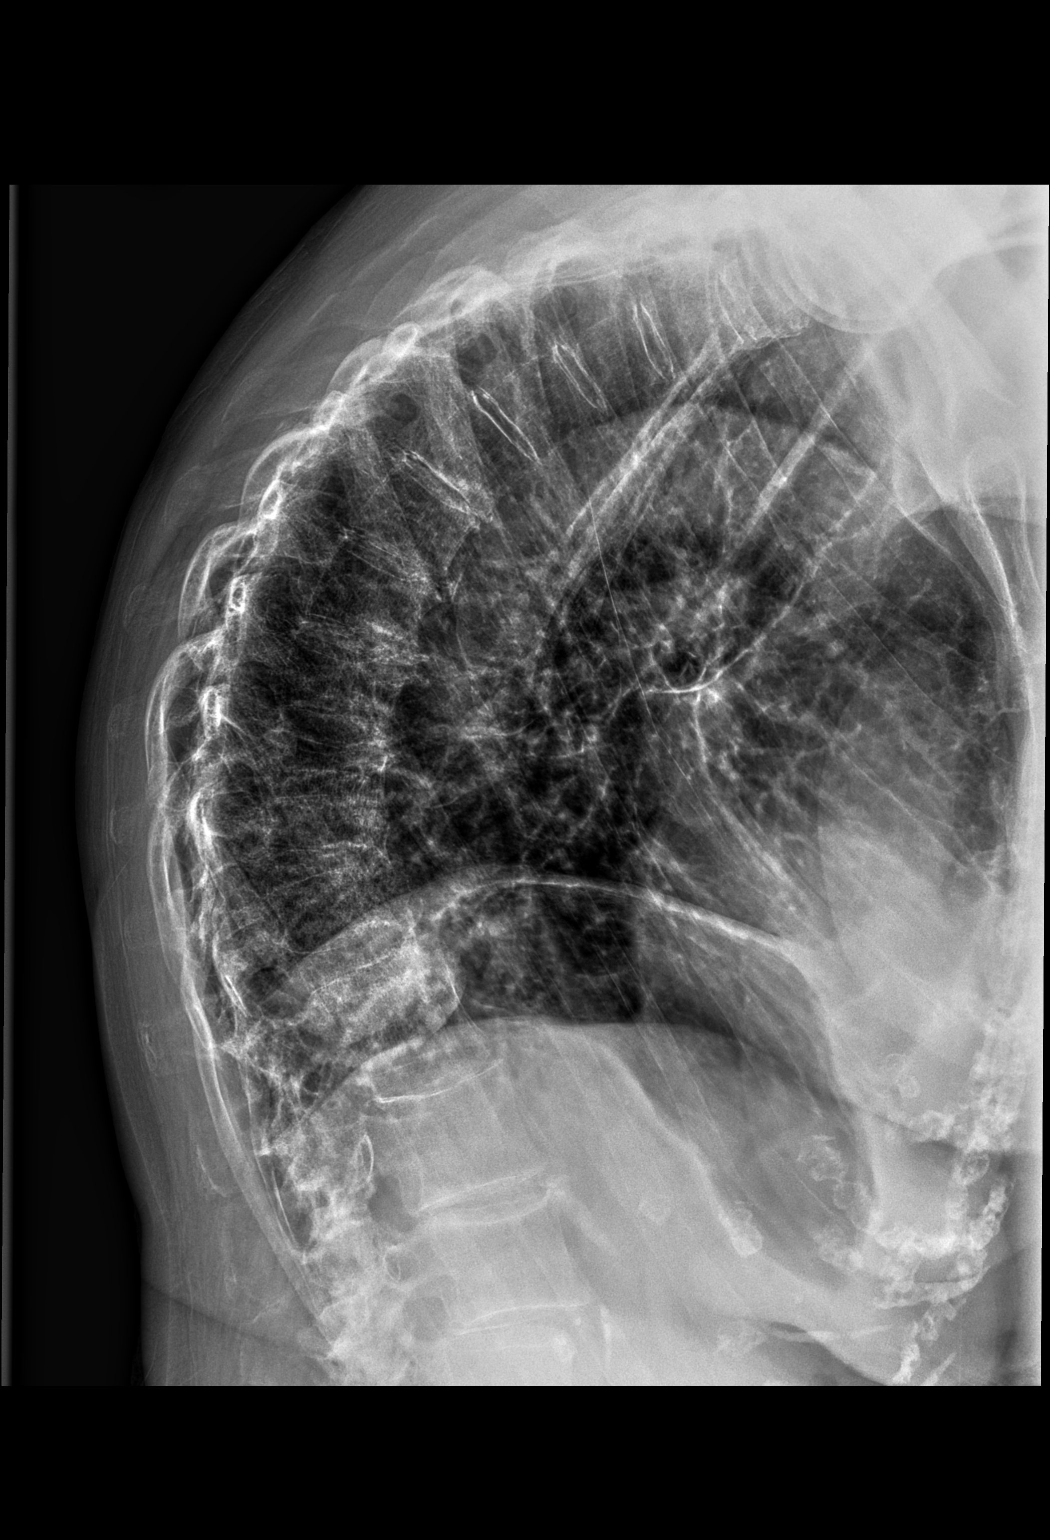
[im 3/3]
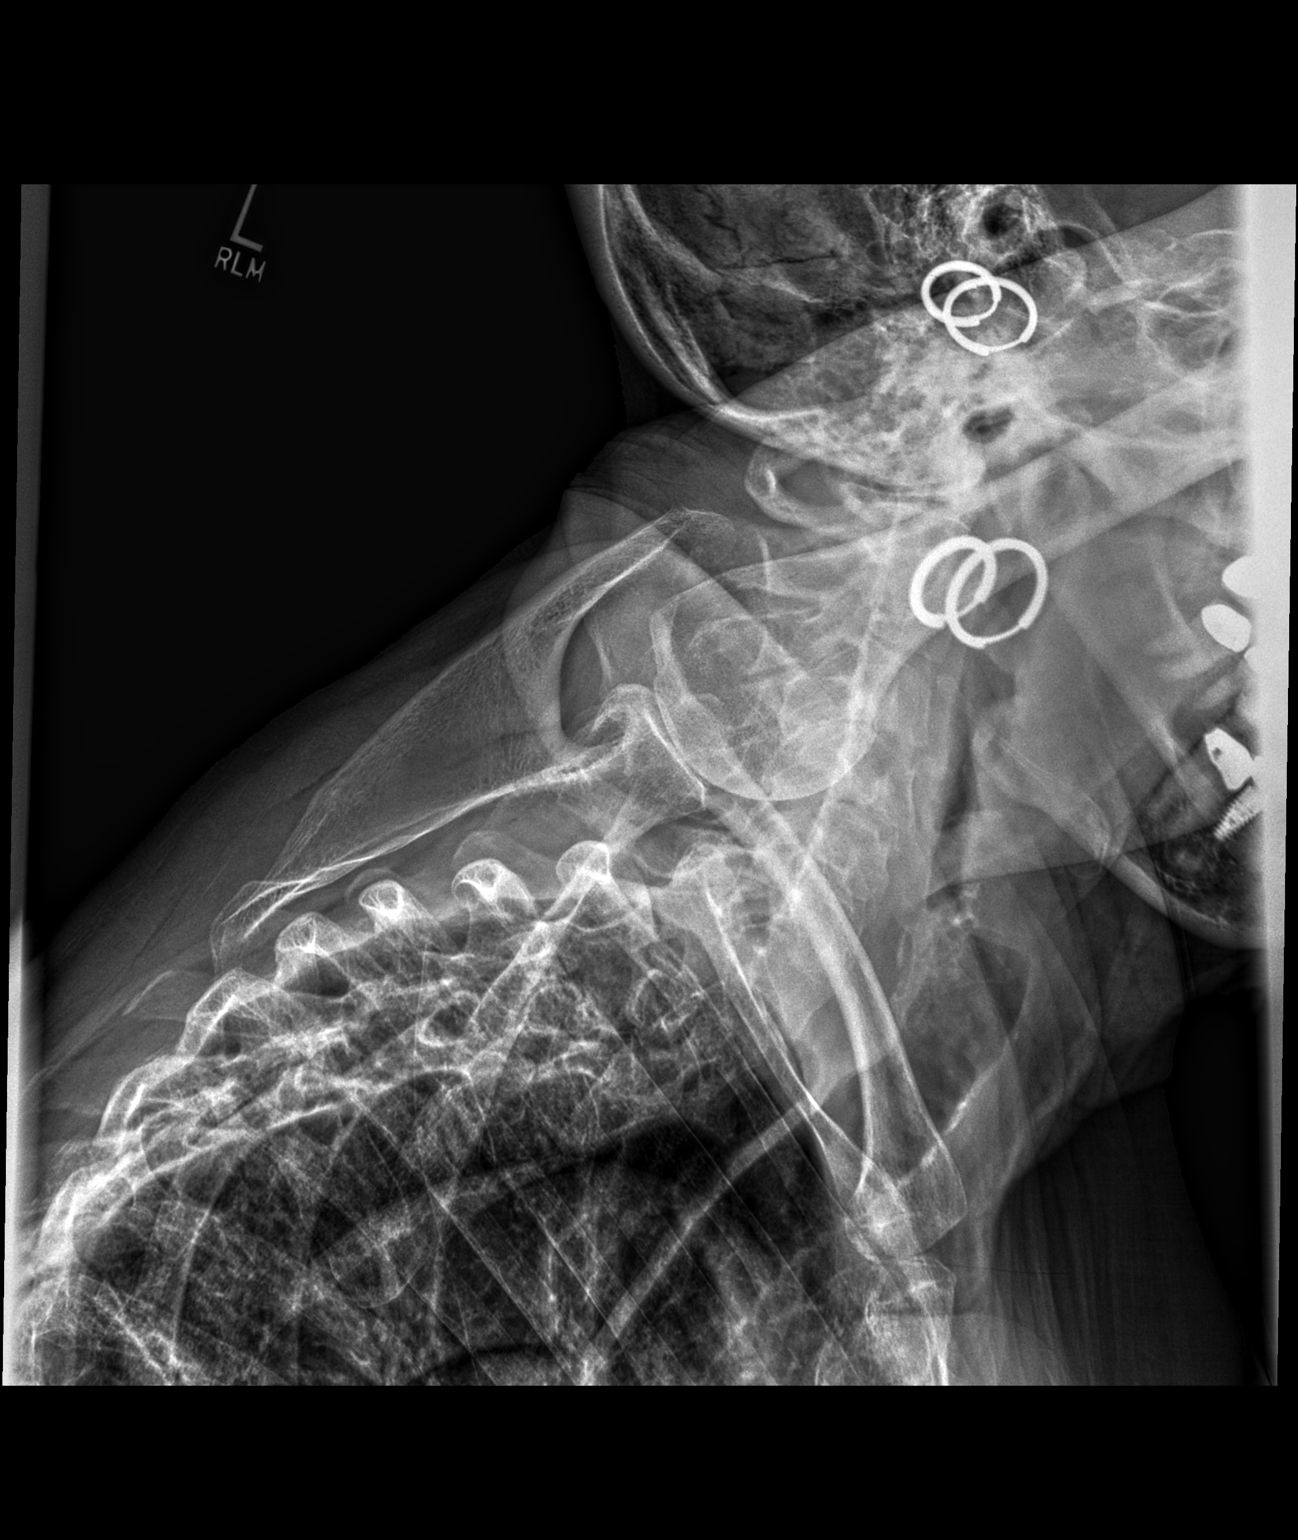

[3 of 3 positions shown; findings below may reference images not displayed]

FINDINGS: Kyphosis and thoracolumbar curvature. Osteopenia. Diffuse disc space 
narrowing and scattered osteophytic spurring of the cervical, thoracic and 
lumbar spine. Scattered facet hypertrophy with greatest involvement of the 
cervical and lower lumbar spine. Trace retrolisthesis C3 on C4 No discrete acute 
vertebral body fracture however this could be easily obscured. 
Left THA is partially included. Right hip joint space appears preserved. Mild 
degenerative changes of the SI joints with mild osteophytic spurring. Sacral 
alar are intact. Normal sacrococcygeal alignment. No discrete sacral fracture. 
Mild bibasilar atelectasis. Included portions of the lungs are otherwise clear. 
Normal bowel gas pattern.
IMPRESSION: 1.  Thoracolumbar curvature with spondylotic changes. 
2.  No discrete acute osseous abnormality. However, this could be easily 
obscured in this osteopenic patient. If symptoms persist, recommend MR exam.

## 2021-03-31 IMAGING — DX SACRUM/COCCYX 2-3 VIEWS
1 series · 1 of 1 positions shown · non-contrast
Comparison: Radiographs of 12/18/2020. CT exam of 11/26/2020.

CERVICAL SPINE 4 OR 5 VIEWS, 03/31/2021 [DATE]: 
CLINICAL INDICATION:  Fall occurred Thursday on 03/26/2021 onto back with 
increased low back pain and mid back pain. History of osteopenia. History of 
left hip replacement. History of colon cancer with colon resection 7925.

[AP]
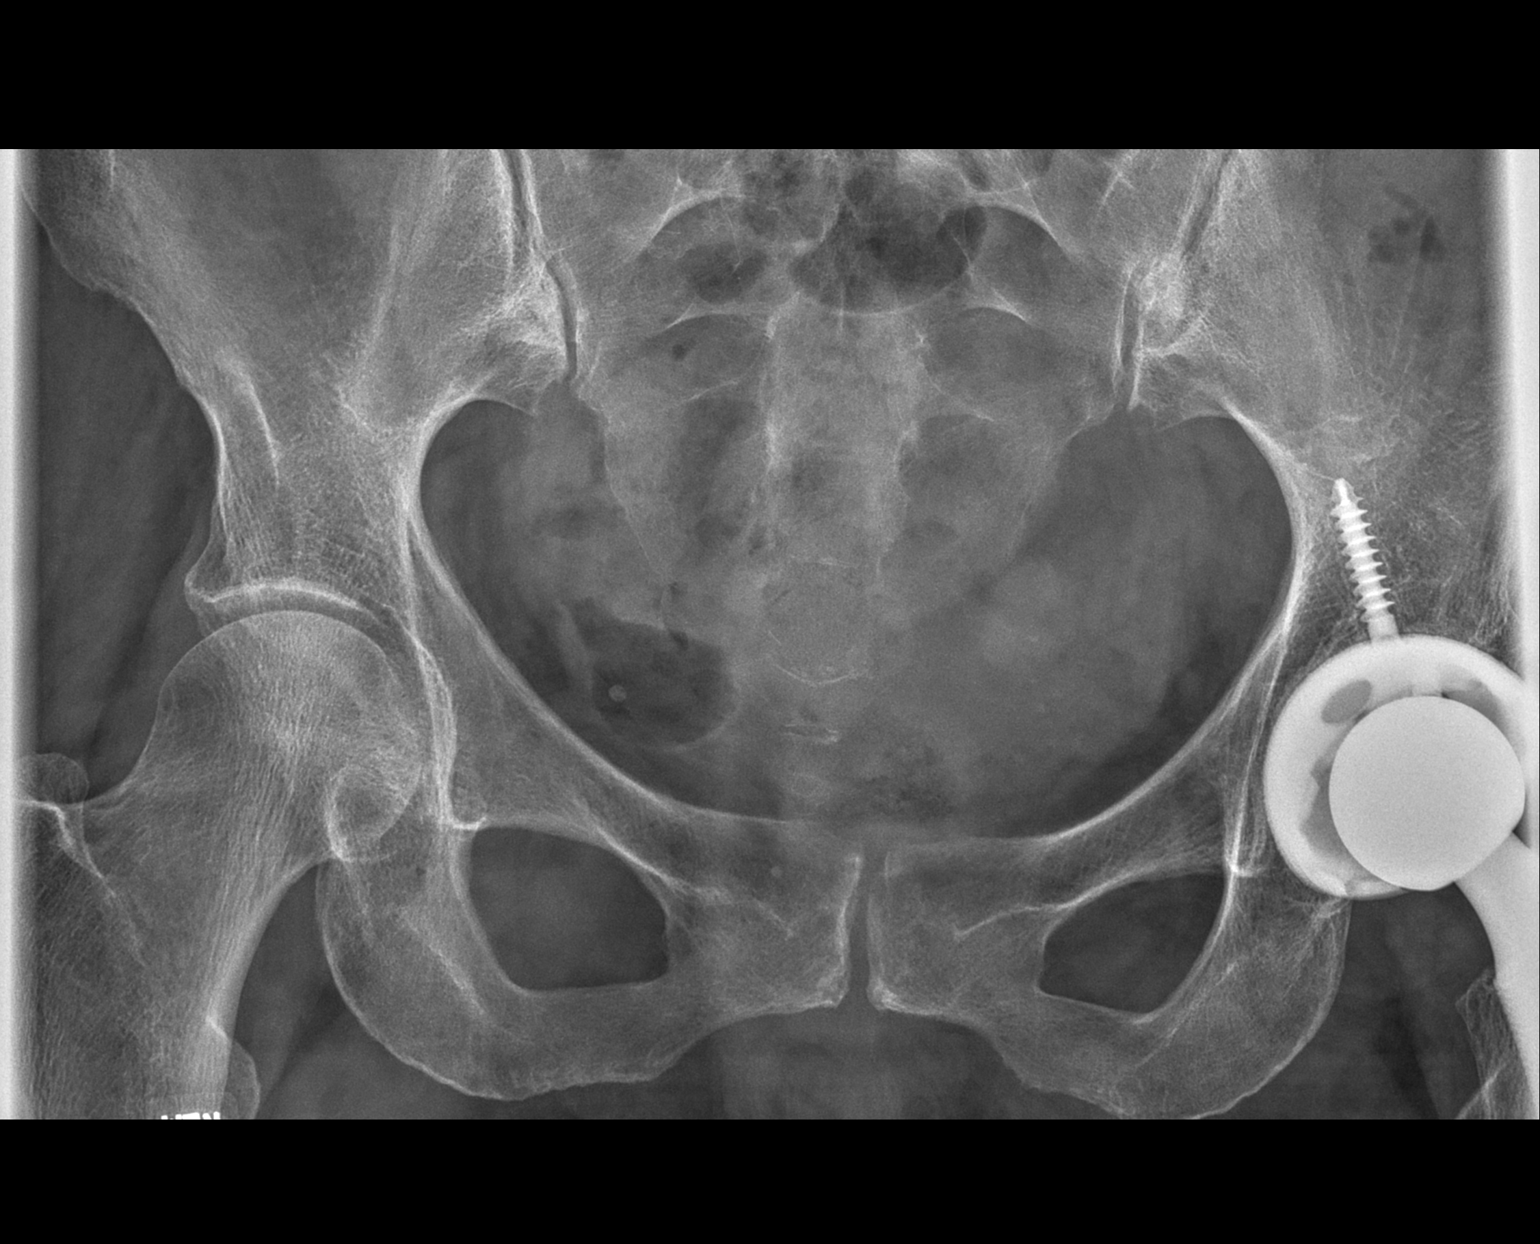

[1 of 1 positions shown; findings below may reference images not displayed]

FINDINGS: Kyphosis and thoracolumbar curvature. Osteopenia. Diffuse disc space 
narrowing and scattered osteophytic spurring of the cervical, thoracic and 
lumbar spine. Scattered facet hypertrophy with greatest involvement of the 
cervical and lower lumbar spine. Trace retrolisthesis C3 on C4 No discrete acute 
vertebral body fracture however this could be easily obscured. 
Left THA is partially included. Right hip joint space appears preserved. Mild 
degenerative changes of the SI joints with mild osteophytic spurring. Sacral 
alar are intact. Normal sacrococcygeal alignment. No discrete sacral fracture. 
Mild bibasilar atelectasis. Included portions of the lungs are otherwise clear. 
Normal bowel gas pattern.
IMPRESSION: 1.  Thoracolumbar curvature with spondylotic changes. 
2.  No discrete acute osseous abnormality. However, this could be easily 
obscured in this osteopenic patient. If symptoms persist, recommend MR exam.

## 2021-03-31 IMAGING — DX LUMBAR SPINE COMPLETE 4 VIEWS
1 series · 4 of 4 positions shown · non-contrast
Comparison: Radiographs of 12/18/2020. CT exam of 11/26/2020.

CERVICAL SPINE 4 OR 5 VIEWS, 03/31/2021 [DATE]: 
CLINICAL INDICATION:  Fall occurred Thursday on 03/26/2021 onto back with 
increased low back pain and mid back pain. History of osteopenia. History of 
left hip replacement. History of colon cancer with colon resection 7925.

[Series 1: AP · 0.14mm/px · 4 of 4 slices shown]
[im 1/4]
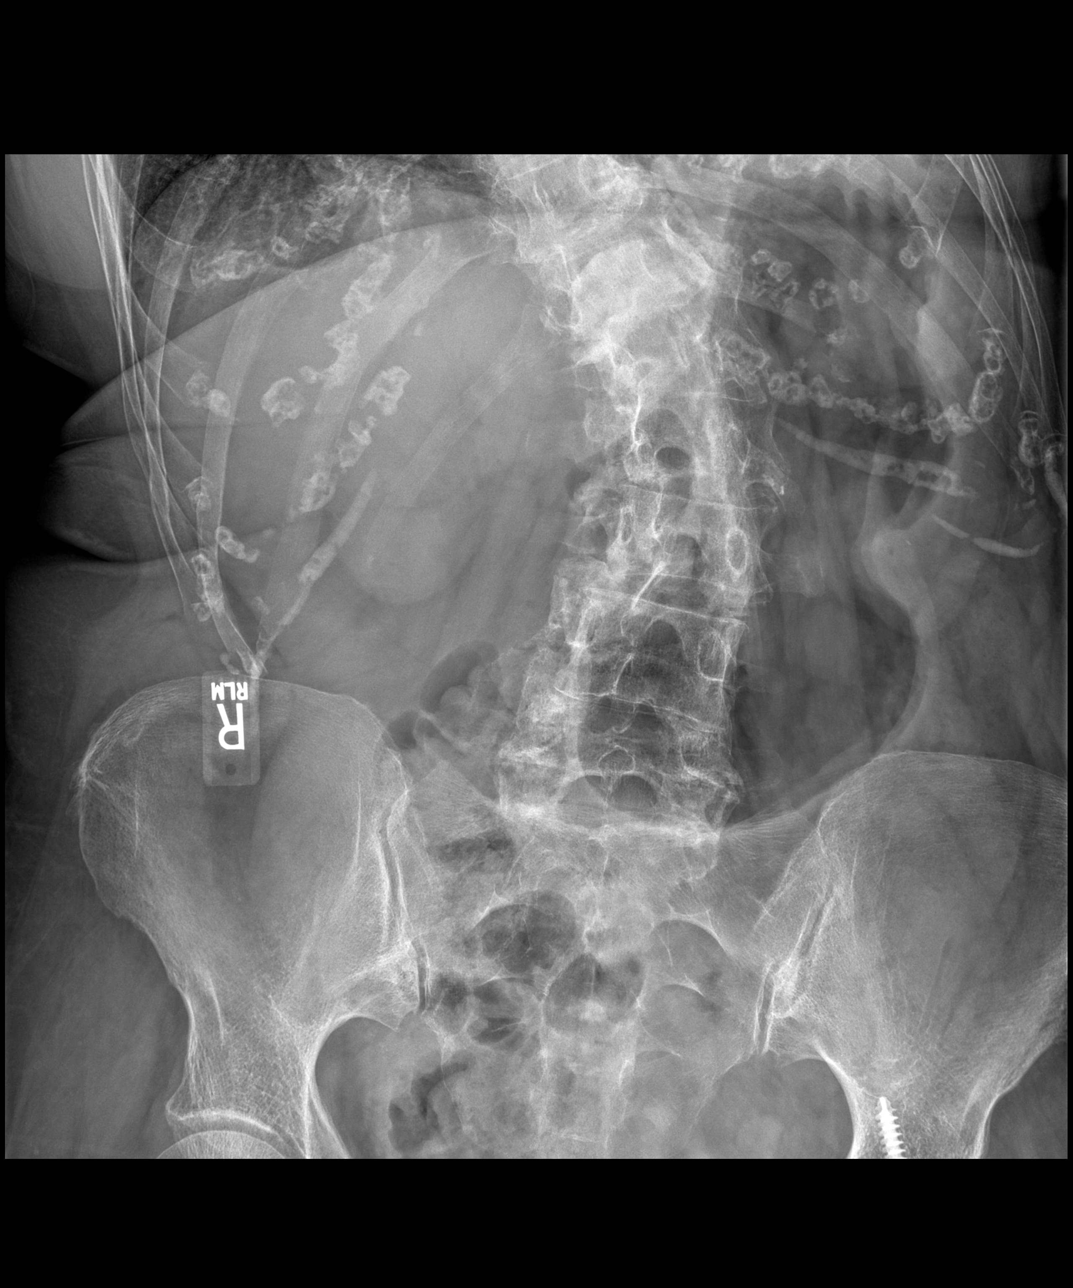
[im 2/4]
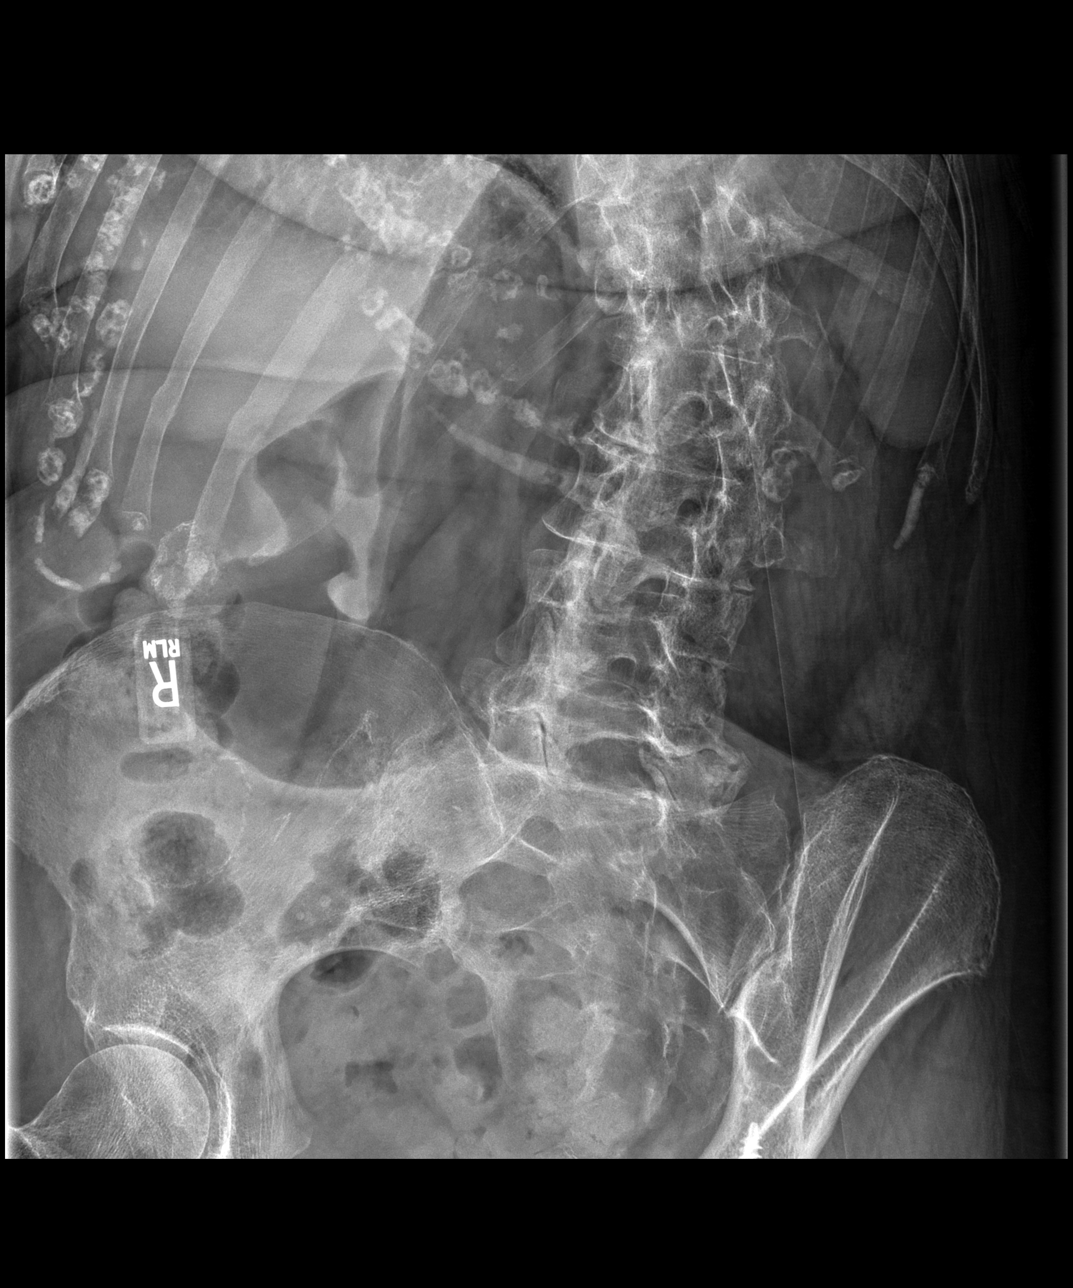
[im 3/4]
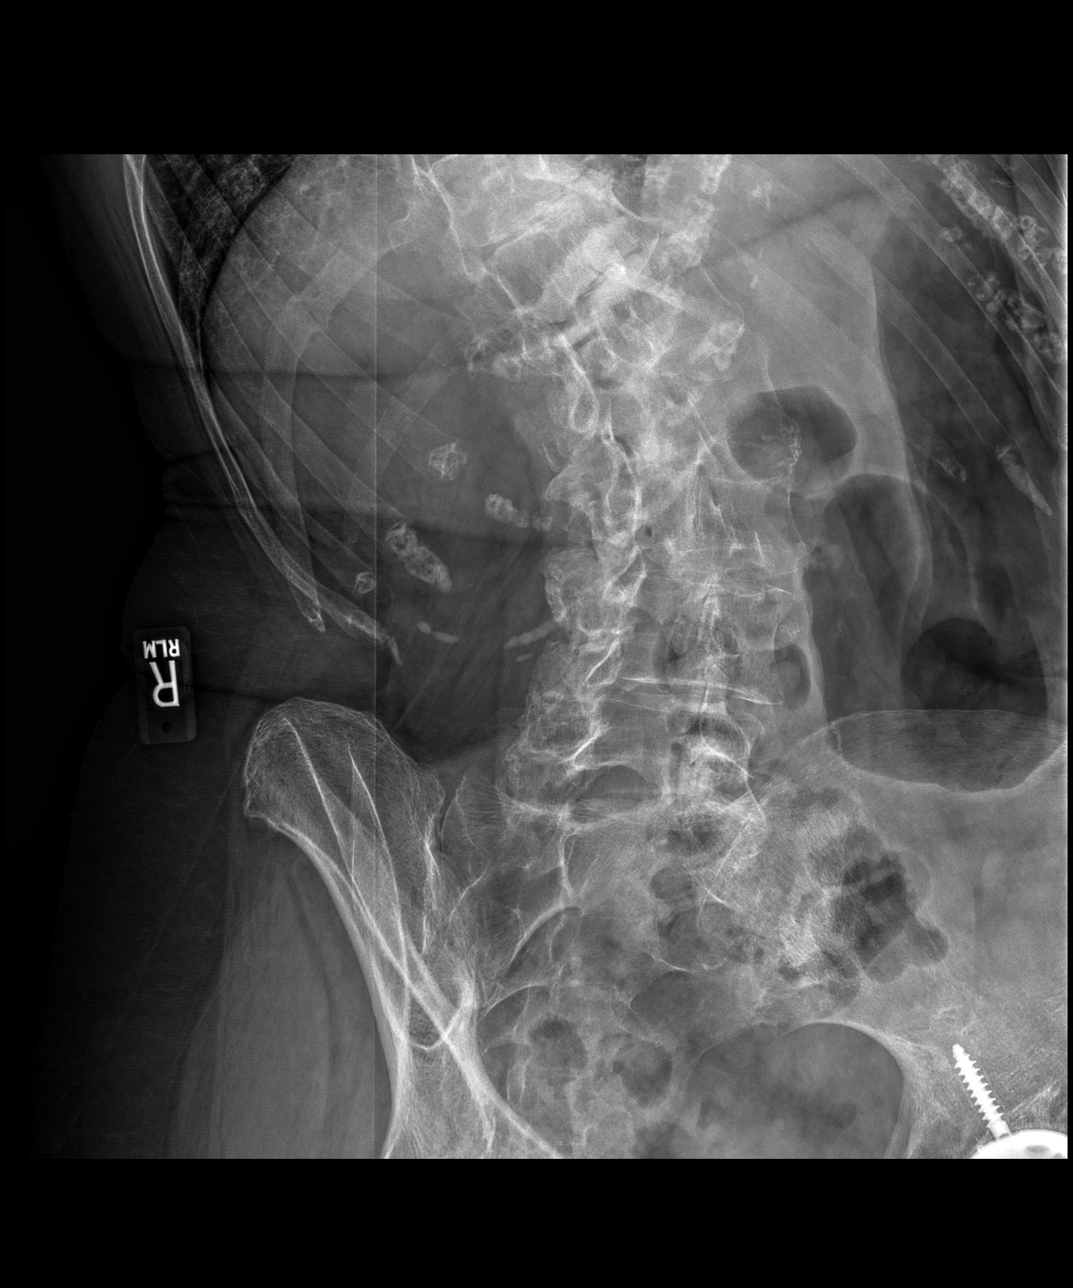
[im 4/4]
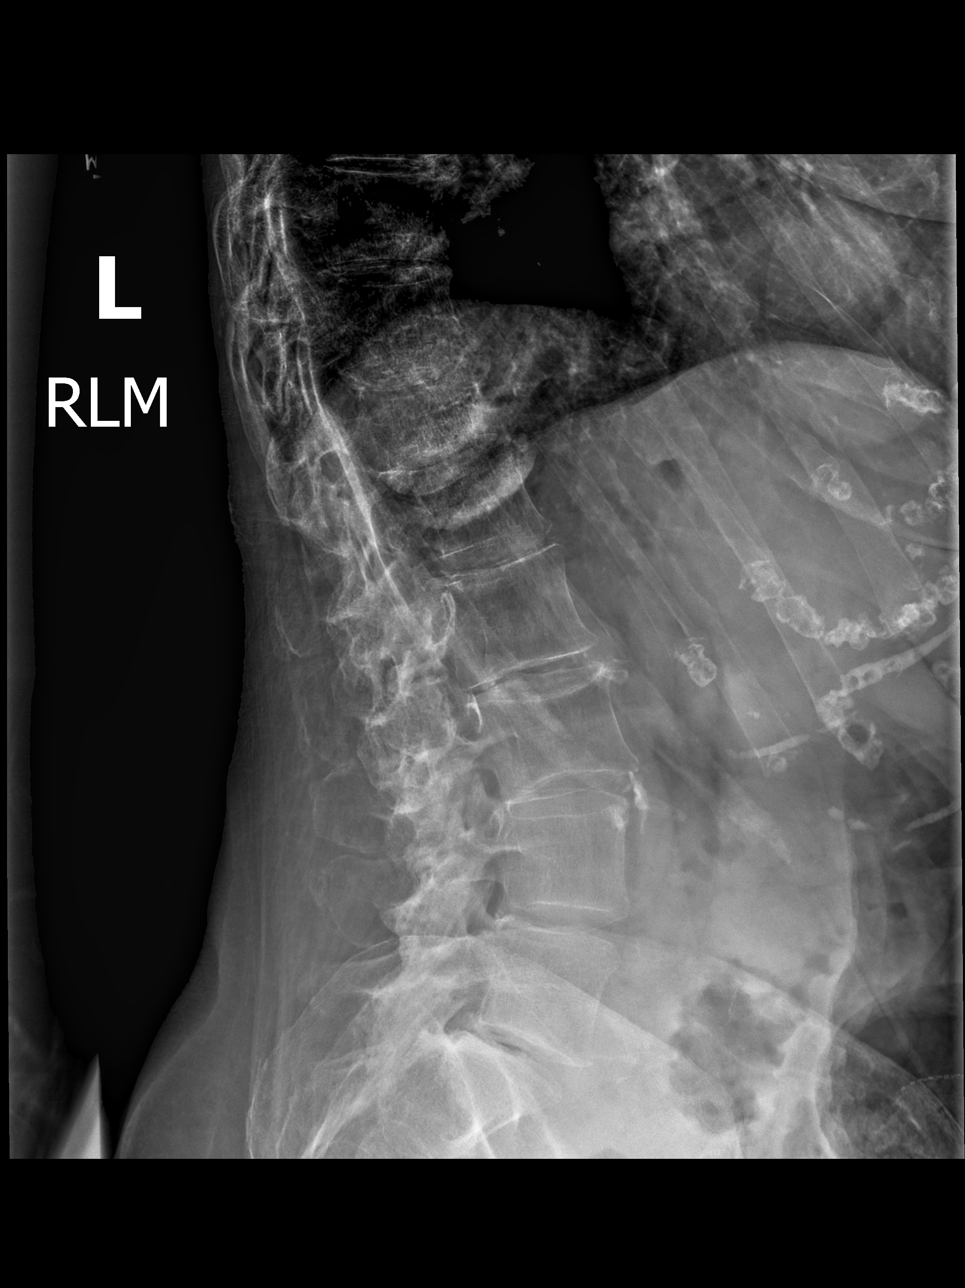

[4 of 4 positions shown; findings below may reference images not displayed]

FINDINGS: Kyphosis and thoracolumbar curvature. Osteopenia. Diffuse disc space 
narrowing and scattered osteophytic spurring of the cervical, thoracic and 
lumbar spine. Scattered facet hypertrophy with greatest involvement of the 
cervical and lower lumbar spine. Trace retrolisthesis C3 on C4 No discrete acute 
vertebral body fracture however this could be easily obscured. 
Left THA is partially included. Right hip joint space appears preserved. Mild 
degenerative changes of the SI joints with mild osteophytic spurring. Sacral 
alar are intact. Normal sacrococcygeal alignment. No discrete sacral fracture. 
Mild bibasilar atelectasis. Included portions of the lungs are otherwise clear. 
Normal bowel gas pattern.
IMPRESSION: 1.  Thoracolumbar curvature with spondylotic changes. 
2.  No discrete acute osseous abnormality. However, this could be easily 
obscured in this osteopenic patient. If symptoms persist, recommend MR exam.

## 2022-09-14 IMAGING — MR MRI THORACIC SPINE WITHOUT CONTRAST
5 of 9 series · 14 of 48 positions shown · IV contrast (gadolinium)
Comparison: 07/04/2021 [facility] MRI and 11/26/2020 [facility] chest CTA

________________________________________________________________________________________________ 
MRI THORACIC SPINE WITHOUT CONTRAST, MRI LUMBAR SPINE WITHOUT CONTRAST, 
09/14/2022 [DATE]: 
CLINICAL INDICATION: Unspecified fracture of unspecified thoracic and lumbar 
vertebrae.
TECHNIQUE: Multiplanar, multiecho position MR images of the thoracic and lumbar 
spine were performed without intravenous gadolinium enhancement. Patient was 
scanned on a 1.5T magnet.

[Series 101: t2_sag_count · sagittal · 4.0mm · 0.62mm/px · 3 of 22 slices shown]
[im 1/22]
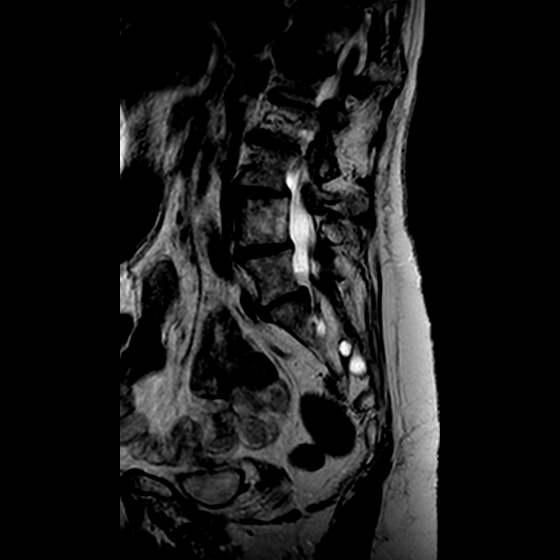
[im 11/22]
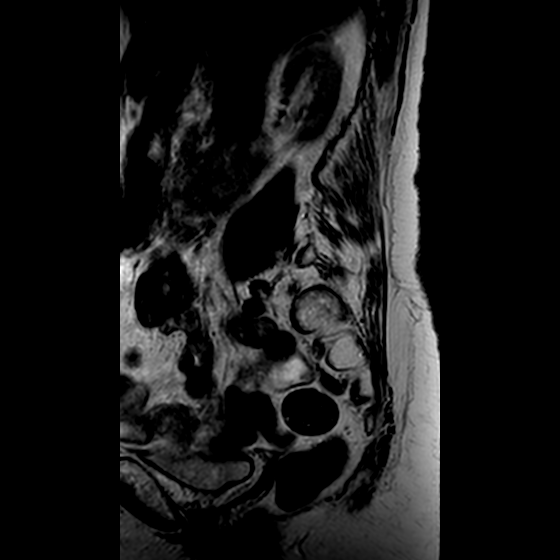
[im 22/22]
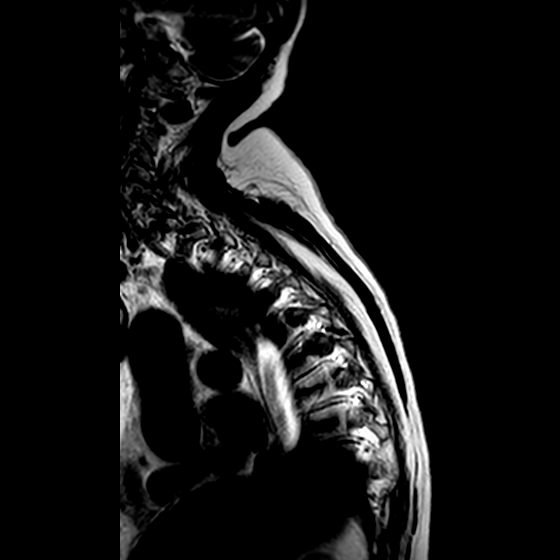

[Series 102: T2 · sagittal · 4.0mm · 0.62mm/px · 2 of 11 slices shown]
[im 1/11]
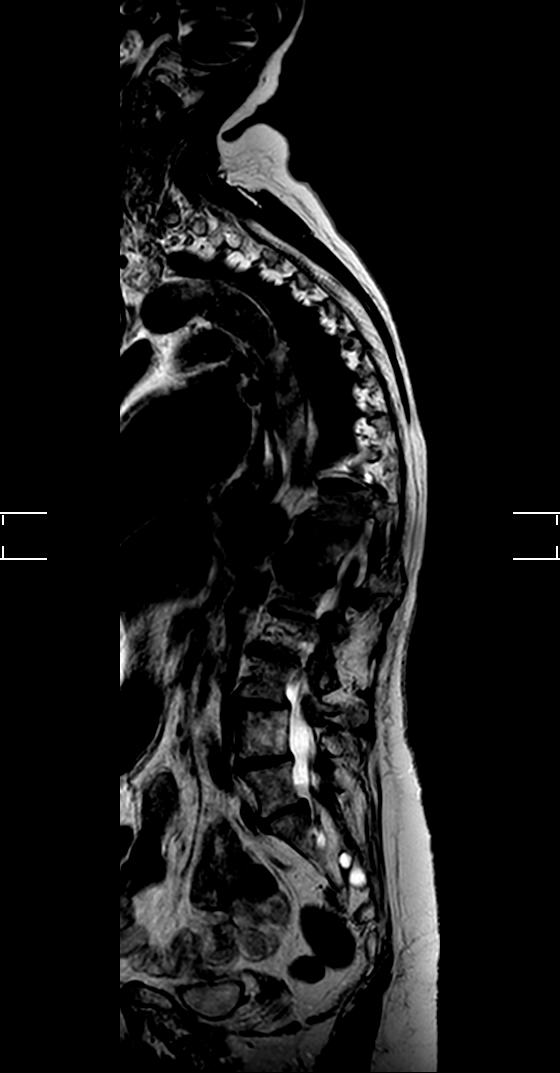
[im 11/11]
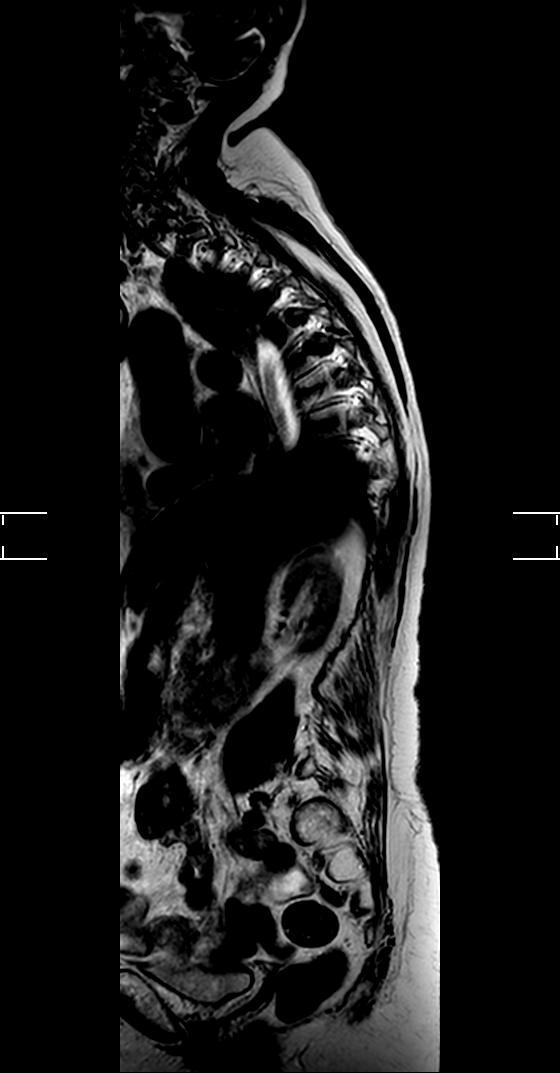

[Series 201: t2_cor_count · coronal · 4.0mm · 0.62mm/px · 3 of 23 slices shown]
[im 1/23]
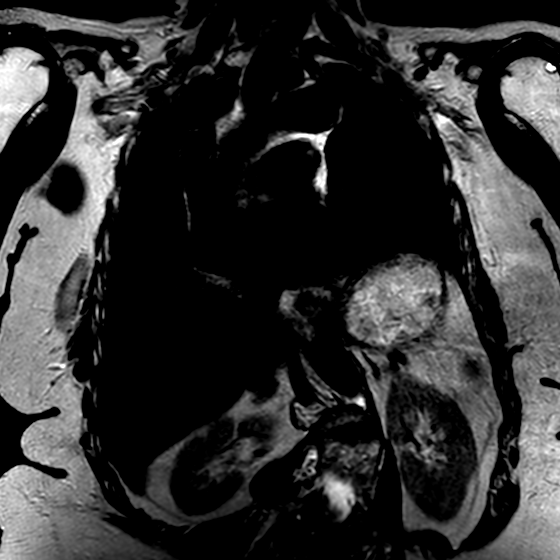
[im 12/23]
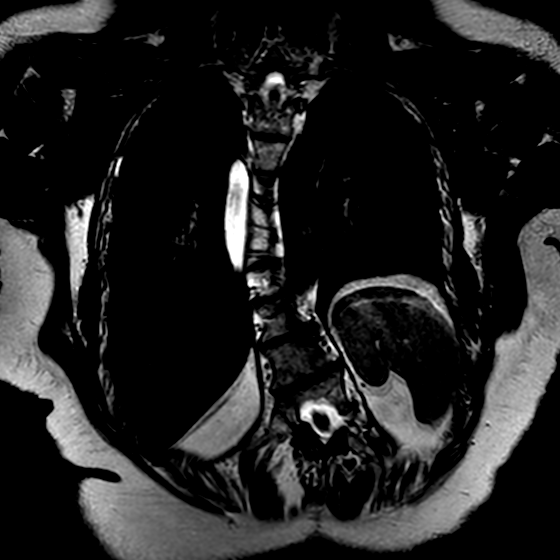
[im 23/23]
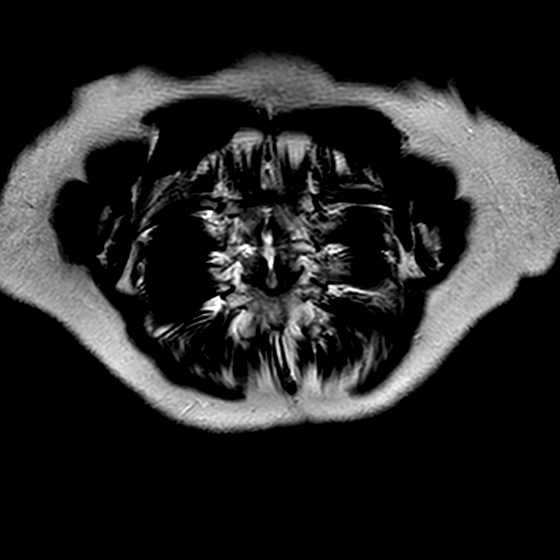

[Series 301: t1_tse_sag · sagittal · 3.0mm · 0.53mm/px · 3 of 25 slices shown]
[im 1/25]
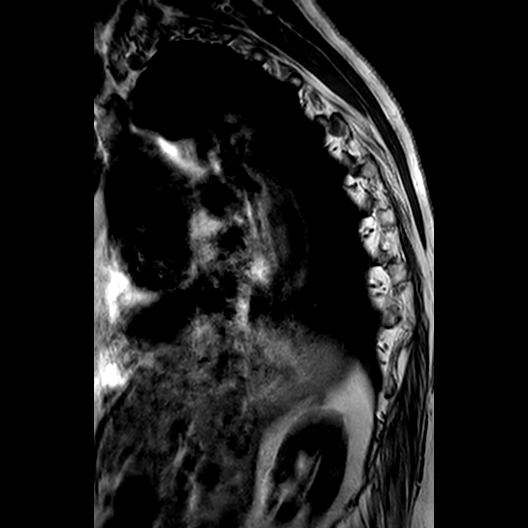
[im 13/25]
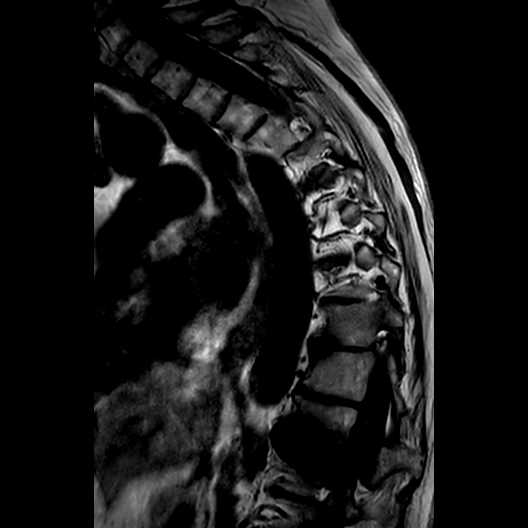
[im 25/25]
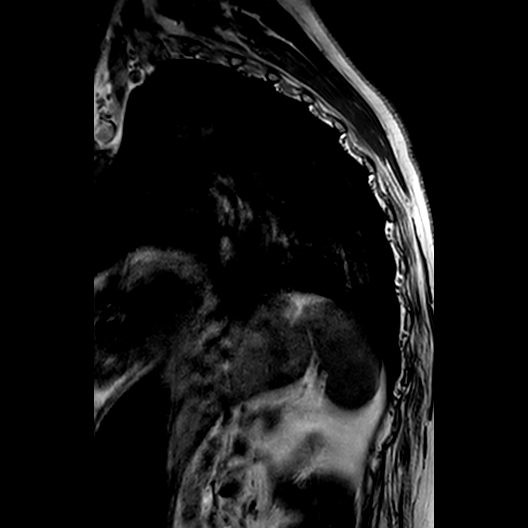

[Series 401: t2_tse_sag · sagittal · 3.0mm · 0.54mm/px · 3 of 25 slices shown]
[im 1/25]
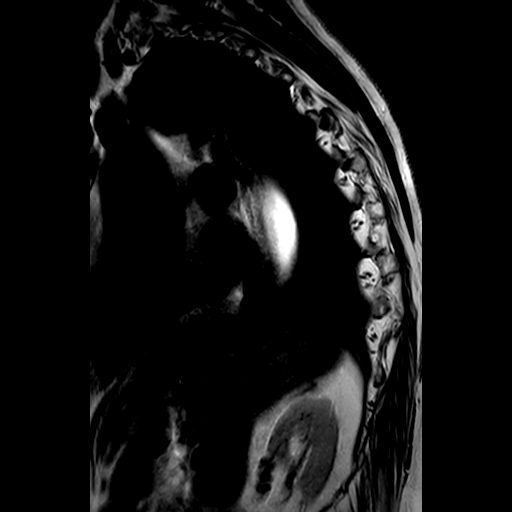
[im 13/25]
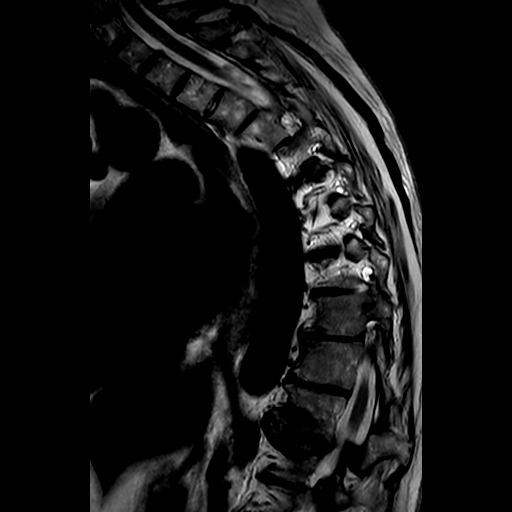
[im 25/25]
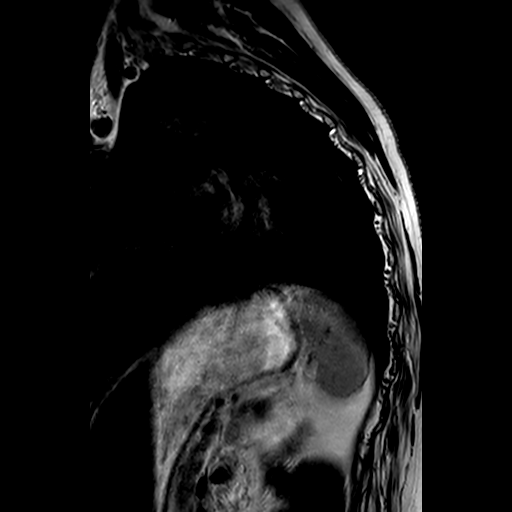

[14 of 48 positions shown; findings below may reference images not displayed]

FINDINGS: GENERAL: 
ALIGNMENT: S-shaped thoracolumbar scoliosis with 18 degrees thoracic 
dextrocurvature and 45 degrees lumbar levocurve. 0.3 cm anterolisthesis at L3-4 
and L4-5. 
VERTEBRAE: No acute fracture. Mild chronic loss of height of the L3 vertebral 
body superior endplate with 12% loss of height. Multifocal degenerative change, 
most marked at T12-L1 with marked right-sided asymmetric disc space narrowing 
and osteophytes. Sacral Tarlov cysts measure up to 2.1 cm. 
MARROW SIGNAL: No focal suspect signal abnormality. 
SPINAL CORD: Normal signal. Conus medullaris is at the level of L1. 
EXTRASPINAL STRUCTURES: Multifocal costovertebral joint hypertrophy, most marked 
on the left at T10-11. Stable dilated, fluid-filled esophagus, consistent with 
gastroesophageal reflux. Small hiatal hernia. 
VERTEBRA: The vertebral bodies are normal in height and alignment. No acute 
vertebral body fracture. No spondylolisthesis. No marrow replacing lesions.  
SPINAL CORD: Flattening of the ventral cord at the level of the T10-11 disc 
protrusion. Spinal cord is otherwise normal in caliber and signal intensity. 
Conus medullaris is at the level of L1.  
THORACIC DISCS: Multilevel degenerative disc disease. Stable C7-T1 tiny left 
paracentral disc protrusion. Stable T10-11 central disc protrusion measures
cm in AP dimensions with flattening of the ventral cord. No thoracic spinal 
stenosis. 
Modic I-II: Type I Modic changes at T6-7, T8-9 and T12-L1 
Ligamentum Flavum > 2.5 mm: All lumbar levels. 
T12-L1: Marked asymmetric disc space narrowing and disc desiccation. No disc 
herniation. Mild facet arthropathy. No central canal stenosis. Mild right 
lateral recess and moderate right neural foraminal stenosis. 
L1-L2: Moderate asymmetric disc space narrowing and disc desiccation. No disc 
herniation. Mild facet arthropathy. No central canal stenosis. Mild right neural 
foraminal stenosis. 
L2-L3: Mild asymmetric disc space narrowing and disc desiccation. No disc 
herniation. Mild facet arthropathy. No spinal canal or neural foraminal 
stenosis. 
L3-L4: Mild disc space narrowing and disc desiccation. No disc herniation. 
Marked right and moderate left facet arthropathy. No spinal canal or neural 
foraminal stenosis. 
L4-L5: Mild disc space narrowing and disc desiccation. No disc herniation. Mild 
facet arthropathy. No spinal canal or neural foraminal stenosis. 
L5-S1: Mild disc space narrowing and disc desiccation. No disc herniation. 
Moderate facet arthropathy. No spinal canal or neural foraminal stenosis. 
OTHER: Localizer view demonstrates degenerative change of the cervical spine. 
The aorta is normal in diameter. The posterior paraspinal soft tissues are 
negative.
IMPRESSION: 1.  No acute fracture. Mild chronic loss of height of the L3 vertebral body. 
2.  Multifocal degenerative change (most marked at T12-L1) and S-shaped 
thoracolumbar scoliosis. 
3.  Stable C7-T1 and T10-11 disc protrusions with flattening of the ventral cord 
at the level of T10-11.  
4.  T12-L1 mild right lateral recess and moderate right neural foraminal 
stenosis. 
5.  L1-L2 mild right neural foraminal stenosis.

## 2022-09-14 IMAGING — MR MRI LUMBAR SPINE WITHOUT CONTRAST
6 of 9 series · 12 of 48 positions shown · IV contrast (gadolinium)
Comparison: 07/04/2021 [facility] MRI and 11/26/2020 [facility] chest CTA

________________________________________________________________________________________________ 
MRI THORACIC SPINE WITHOUT CONTRAST, MRI LUMBAR SPINE WITHOUT CONTRAST, 
09/14/2022 [DATE]: 
CLINICAL INDICATION: Unspecified fracture of unspecified thoracic and lumbar 
vertebrae.
TECHNIQUE: Multiplanar, multiecho position MR images of the thoracic and lumbar 
spine were performed without intravenous gadolinium enhancement. Patient was 
scanned on a 1.5T magnet.

[Series 101: survey · axial · 10.0mm · 1.25mm/px · z∈[-33,+201]mm · 2 of 10 slices shown]
[im 1/10]
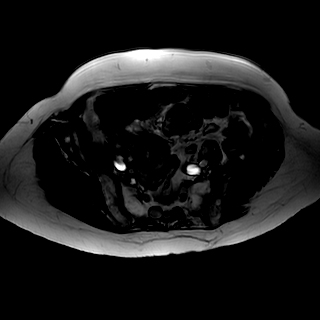
[im 10/10]
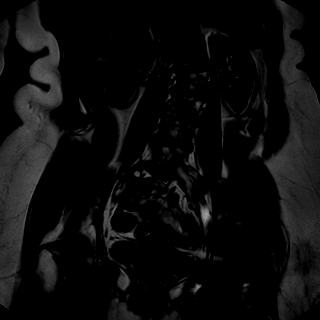

[Series 201: t2w_cor-surv · coronal · 6.0mm · 0.62mm/px · 3 of 12 slices shown]
[im 1/12]
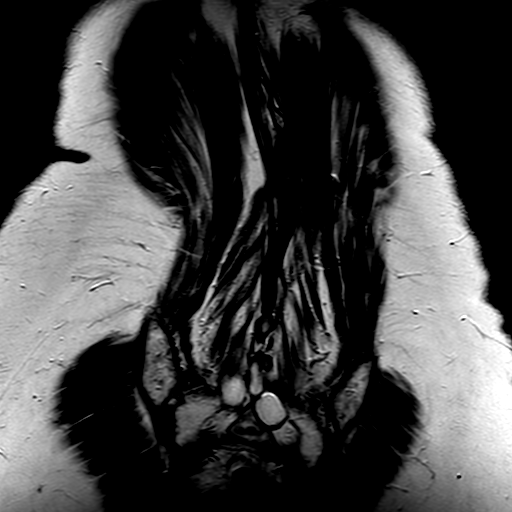
[im 6/12]
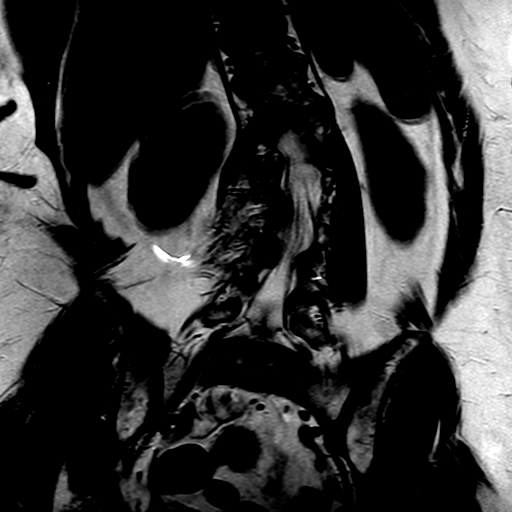
[im 12/12]
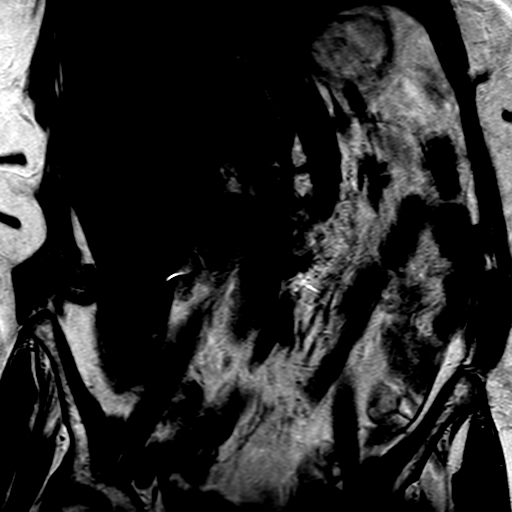

[Series 301: t1_tse_sag · sagittal · 4.0mm · 0.41mm/px · 2 of 19 slices shown]
[im 1/19]
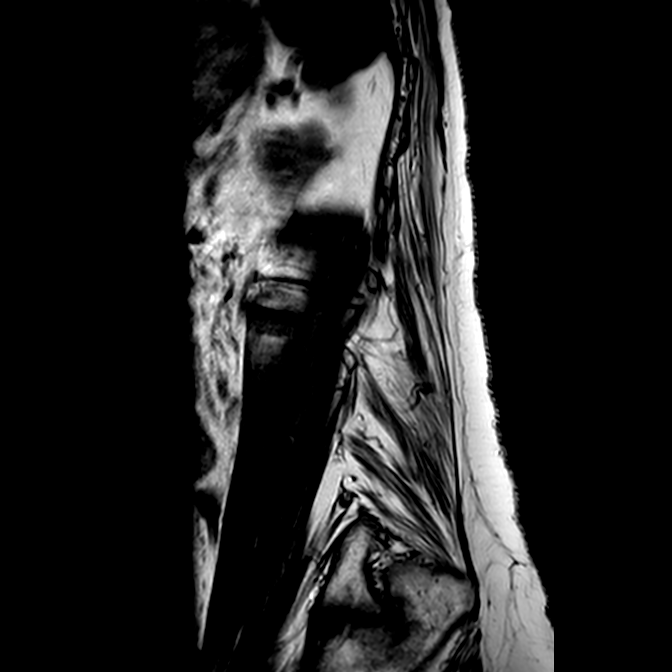
[im 19/19]
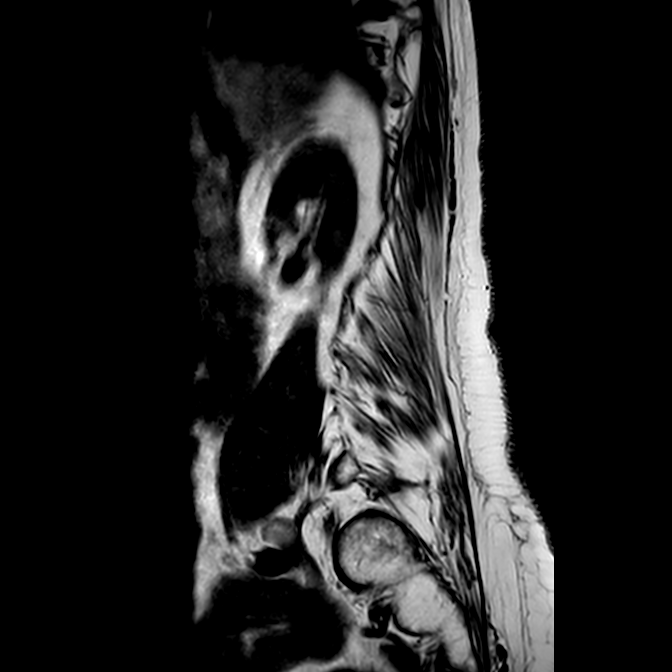

[Series 402: (id)_mdixon_tse · sagittal · 4.0mm · 0.36mm/px · 2 of 19 slices shown]
[im 1/19]
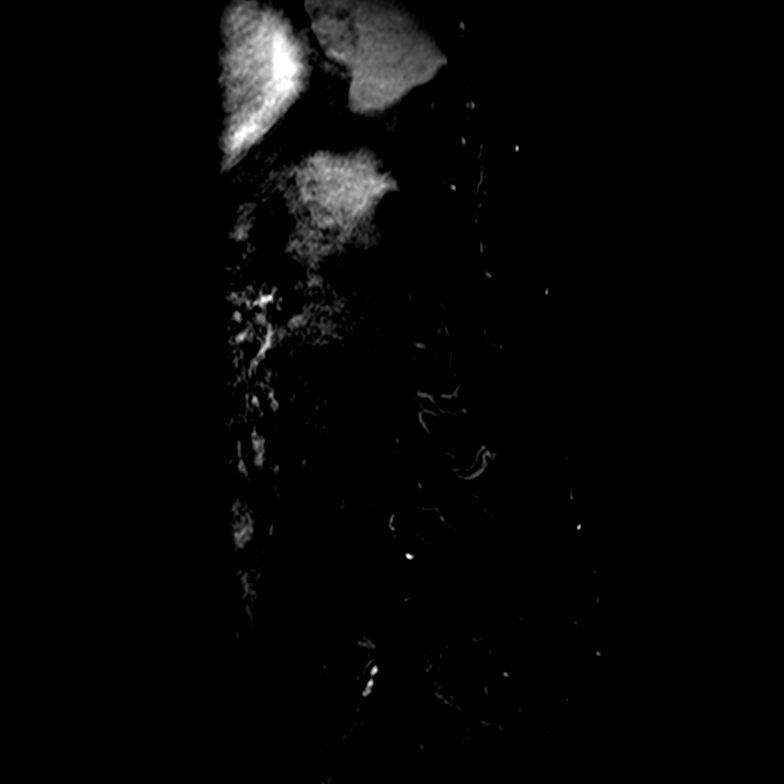
[im 19/19]
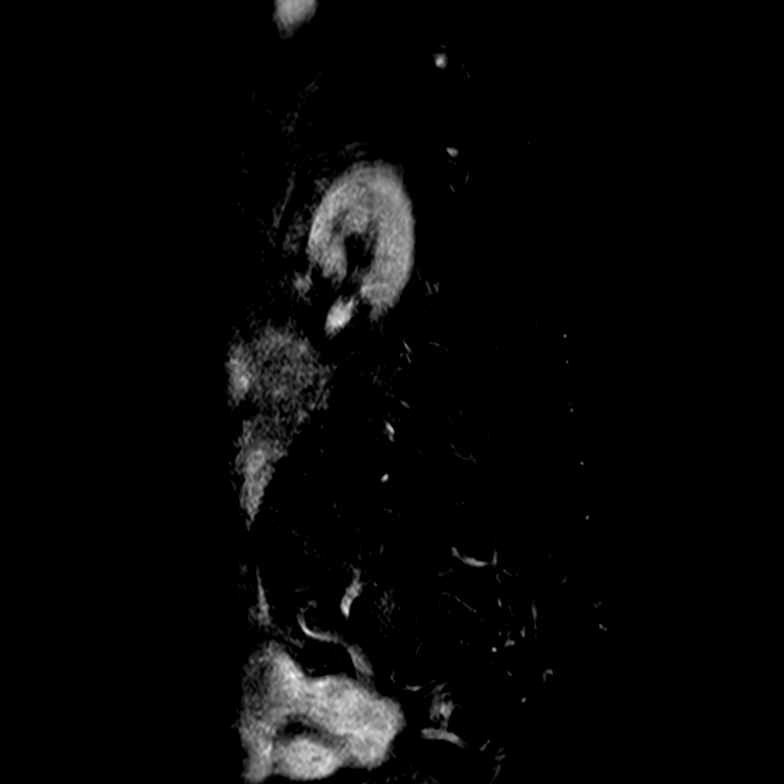

[Series 403: st2w_mdixon_tse · sagittal · 4.0mm · 0.36mm/px · 2 of 19 slices shown]
[im 1/19]
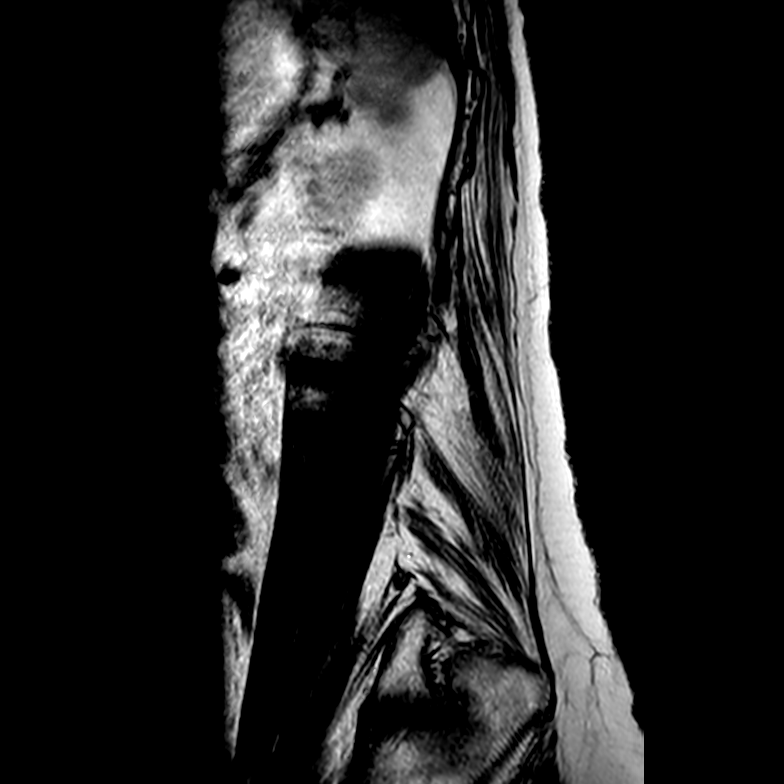
[im 19/19]
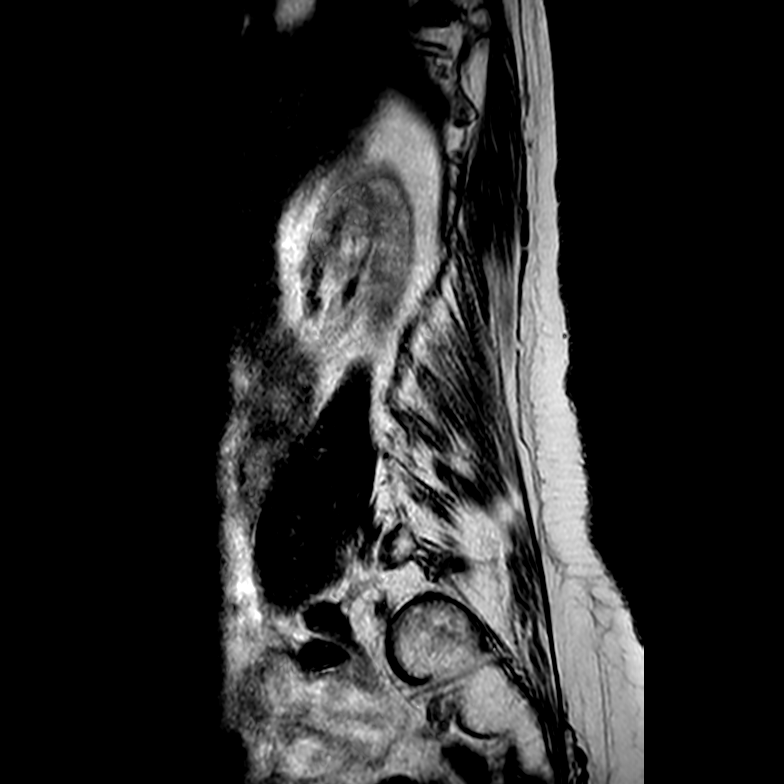

[Series 501: stir_sag · sagittal · 4.0mm · 0.44mm/px · 1 of 19 slices shown]
[im 1/19]
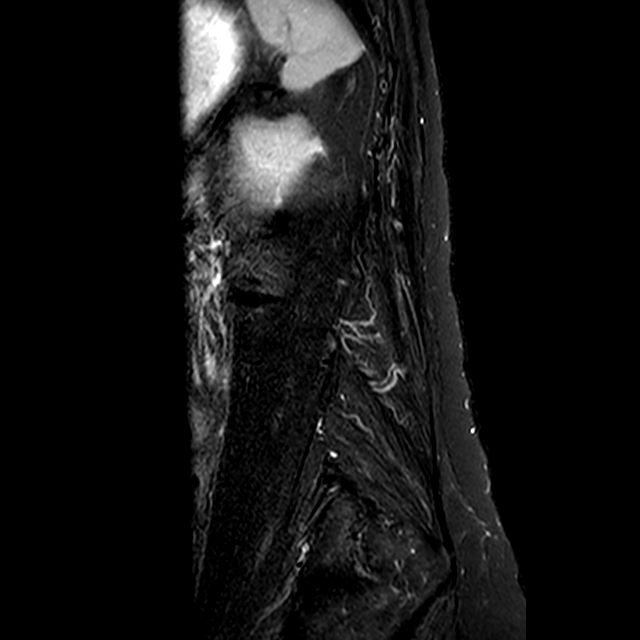

[12 of 48 positions shown; findings below may reference images not displayed]

FINDINGS: GENERAL: 
ALIGNMENT: S-shaped thoracolumbar scoliosis with 18 degrees thoracic 
dextrocurvature and 45 degrees lumbar levocurve. 0.3 cm anterolisthesis at L3-4 
and L4-5. 
VERTEBRAE: No acute fracture. Mild chronic loss of height of the L3 vertebral 
body superior endplate with 12% loss of height. Multifocal degenerative change, 
most marked at T12-L1 with marked right-sided asymmetric disc space narrowing 
and osteophytes. Sacral Tarlov cysts measure up to 2.1 cm. 
MARROW SIGNAL: No focal suspect signal abnormality. 
SPINAL CORD: Normal signal. Conus medullaris is at the level of L1. 
EXTRASPINAL STRUCTURES: Multifocal costovertebral joint hypertrophy, most marked 
on the left at T10-11. Stable dilated, fluid-filled esophagus, consistent with 
gastroesophageal reflux. Small hiatal hernia. 
VERTEBRA: The vertebral bodies are normal in height and alignment. No acute 
vertebral body fracture. No spondylolisthesis. No marrow replacing lesions.  
SPINAL CORD: Flattening of the ventral cord at the level of the T10-11 disc 
protrusion. Spinal cord is otherwise normal in caliber and signal intensity. 
Conus medullaris is at the level of L1.  
THORACIC DISCS: Multilevel degenerative disc disease. Stable C7-T1 tiny left 
paracentral disc protrusion. Stable T10-11 central disc protrusion measures
cm in AP dimensions with flattening of the ventral cord. No thoracic spinal 
stenosis. 
Modic I-II: Type I Modic changes at T6-7, T8-9 and T12-L1 
Ligamentum Flavum > 2.5 mm: All lumbar levels. 
T12-L1: Marked asymmetric disc space narrowing and disc desiccation. No disc 
herniation. Mild facet arthropathy. No central canal stenosis. Mild right 
lateral recess and moderate right neural foraminal stenosis. 
L1-L2: Moderate asymmetric disc space narrowing and disc desiccation. No disc 
herniation. Mild facet arthropathy. No central canal stenosis. Mild right neural 
foraminal stenosis. 
L2-L3: Mild asymmetric disc space narrowing and disc desiccation. No disc 
herniation. Mild facet arthropathy. No spinal canal or neural foraminal 
stenosis. 
L3-L4: Mild disc space narrowing and disc desiccation. No disc herniation. 
Marked right and moderate left facet arthropathy. No spinal canal or neural 
foraminal stenosis. 
L4-L5: Mild disc space narrowing and disc desiccation. No disc herniation. Mild 
facet arthropathy. No spinal canal or neural foraminal stenosis. 
L5-S1: Mild disc space narrowing and disc desiccation. No disc herniation. 
Moderate facet arthropathy. No spinal canal or neural foraminal stenosis. 
OTHER: Localizer view demonstrates degenerative change of the cervical spine. 
The aorta is normal in diameter. The posterior paraspinal soft tissues are 
negative.
IMPRESSION: 1.  No acute fracture. Mild chronic loss of height of the L3 vertebral body. 
2.  Multifocal degenerative change (most marked at T12-L1) and S-shaped 
thoracolumbar scoliosis. 
3.  Stable C7-T1 and T10-11 disc protrusions with flattening of the ventral cord 
at the level of T10-11.  
4.  T12-L1 mild right lateral recess and moderate right neural foraminal 
stenosis. 
5.  L1-L2 mild right neural foraminal stenosis.
